# Patient Record
Sex: Female | Born: 1955
Health system: Southern US, Community
[De-identification: ages and names within clinical notes are randomized; demographics above are authoritative.]

## PROBLEM LIST (undated history)

## (undated) DIAGNOSIS — J449 Chronic obstructive pulmonary disease, unspecified: Secondary | ICD-10-CM

## (undated) DIAGNOSIS — I4892 Unspecified atrial flutter: Secondary | ICD-10-CM

## (undated) DIAGNOSIS — I10 Essential (primary) hypertension: Secondary | ICD-10-CM

## (undated) HISTORY — PX: TEMPOROMANDIBULAR JOINT SURGERY: SHX35

## (undated) HISTORY — DX: Chronic obstructive pulmonary disease, unspecified: J44.9

## (undated) HISTORY — PX: TONSILLECTOMY: SUR1361

## (undated) HISTORY — DX: Unspecified atrial flutter: I48.92

## (undated) HISTORY — PX: ABDOMINAL HYSTERECTOMY: SHX81

---

## 2001-09-04 ENCOUNTER — Other Ambulatory Visit: Admission: RE | Admit: 2001-09-04 | Discharge: 2001-09-04 | Payer: Self-pay | Admitting: Obstetrics and Gynecology

## 2001-10-02 ENCOUNTER — Other Ambulatory Visit: Admission: RE | Admit: 2001-10-02 | Discharge: 2001-10-02 | Payer: Self-pay | Admitting: Obstetrics and Gynecology

## 2002-09-04 ENCOUNTER — Ambulatory Visit (HOSPITAL_COMMUNITY): Admission: RE | Admit: 2002-09-04 | Discharge: 2002-09-04 | Payer: Self-pay | Admitting: Obstetrics and Gynecology

## 2002-09-04 ENCOUNTER — Encounter: Payer: Self-pay | Admitting: Obstetrics and Gynecology

## 2002-09-26 ENCOUNTER — Observation Stay (HOSPITAL_COMMUNITY): Admission: RE | Admit: 2002-09-26 | Discharge: 2002-09-27 | Payer: Self-pay | Admitting: Obstetrics and Gynecology

## 2002-11-19 ENCOUNTER — Encounter: Payer: Self-pay | Admitting: Internal Medicine

## 2002-11-19 ENCOUNTER — Ambulatory Visit (HOSPITAL_COMMUNITY): Admission: RE | Admit: 2002-11-19 | Discharge: 2002-11-19 | Payer: Self-pay | Admitting: Internal Medicine

## 2005-07-12 ENCOUNTER — Ambulatory Visit (HOSPITAL_COMMUNITY): Admission: RE | Admit: 2005-07-12 | Discharge: 2005-07-12 | Payer: Self-pay | Admitting: Internal Medicine

## 2006-07-20 ENCOUNTER — Ambulatory Visit (HOSPITAL_COMMUNITY): Admission: RE | Admit: 2006-07-20 | Discharge: 2006-07-20 | Payer: Self-pay | Admitting: Internal Medicine

## 2007-11-19 ENCOUNTER — Ambulatory Visit (HOSPITAL_COMMUNITY): Admission: RE | Admit: 2007-11-19 | Discharge: 2007-11-19 | Payer: Self-pay | Admitting: Internal Medicine

## 2010-06-27 ENCOUNTER — Ambulatory Visit (HOSPITAL_COMMUNITY)
Admission: RE | Admit: 2010-06-27 | Discharge: 2010-06-27 | Payer: Self-pay | Source: Home / Self Care | Admitting: Internal Medicine

## 2011-02-03 NOTE — Op Note (Signed)
Sophia Mccoy, MASKELL                          ACCOUNT NO.:  [id number]   MEDICAL RECORD NO.:  [id number]                   PATIENT TYPE:  INP   LOCATION:  A428                                 FACILITY:  [facility]   PHYSICIAN:  Sophia Mccoy, M.D.              DATE OF BIRTH:  07-13-1956   DATE OF PROCEDURE:  09/26/2002  DATE OF DISCHARGE:                                 OPERATIVE REPORT   PREOPERATIVE DIAGNOSES:  1. Rectocele.  2. Uterine descensus.   POSTOPERATIVE DIAGNOSES:  1. Rectocele.  2. Uterine descensus, second degree.   PROCEDURES:  1. Vaginal hysterectomy.  2. Left oophorectomy.  3. Right salpingo-oophorectomy.  4. Posterior vaginal repair.   SURGEON:  Sophia Mccoy, M.D.   ASSISTANTEarlene Plater, R.N., Montelongo, C.S.T., Alinda Money, R.N.   ANESTHESIA:  General,  __________ C.R.N.A.   COMPLICATIONS:  None.   ESTIMATED BLOOD LOSS:  Less than 50 cubic centimeters.   FINDINGS:  Generalized lax tissues throughout perineum.  Clinically  significant rectocele.  Uterus easily descending to introitus while patient  asleep.  No left tube found, status post tubal sterilization.   DESCRIPTION OF PROCEDURE:  The patient was taken to the operating room,  prepped and draped for vaginal procedure with legs in candy cane leg  supports with comfortable positioning.  Perineum was draped with vaginal bib  in place, and cervix grasped with the thyroid tenaculum.  The cervix was  circumscribed with the Bovie cautery and the bladder flap elevated  anteriorly.  Posterior colpotomy incision was made, identifying the  peritoneal cavity.  The cul-de-sac tissues were quite loose.  A weighted  speculum was placed in the posterior vaginal vault.  A three-inch weighted  speculum was placed in the posterior vaginal vault.   The uterosacral ligaments were clamped with a Z clamp, cut, and suture  ligated with 0 chromic.  The lower cardinal ligaments were then isolated,  clamped, cut, and  suture ligated on either side.  At this time the anterior  vesicouterine reflection of the peritoneum was identified and the peritoneal  bladder flap opened.  The upper cardinal ligaments were then taken down  using Zeppelin clamps, Mayo scissors transection, and 0 chromic suture  ligature.  At this point the broad ligament remnants were serially clamped,  cut, and suture ligated on either side using 0 chromic, Zeppelin clamps, and  the Mayo scissors.  At this time the ovary on the patient's left was easily  visible.  The left supporting tissues of the uterus were then taken down,  cross-clamping the round ligament on the left, transecting it, then  inspecting the left adnexa.  It was felt that it would be easiest if the  uterus was freed up first and then the tube and ovary identified and taken  out separately.  Therefore, a Zeppelin clamp was placed across the remaining  supportive ligaments including the utero-ovarian ligament,  cut, and suture  ligated with 0 chromic.   The infundibulopelvic ligament on the left side was easily identified.  There were no fallopian tube remnants on the left side.  It is theorized  that those were destroyed as a part of the tubal sterilization procedure.  The left infundibulopelvic ligament was crossclamped just above the ovary,  the ovary cut free, and the pedicle ligated.  Hemostasis was excellent.  Attention was then directed to the right side of the uterus, where the utero-  ovarian ligament was clamped, cut, and suture ligated, and the round  ligament on this side taken down separately.  The infundibulopelvic ligament  could be identified just above a broad, flat, normal-appearing right ovary,  which was pulled down to reveal a small remnant of fallopian tube on the  right side, and both tube remnant and ovary were taken out by cross-clamping  just above the ovary on the IP ligament, removing tube and ovary, and doubly  ligating the IP ligament  pedicle.  Hemostasis was excellent.   The posterior pelvic structures were inspected, and the uterosacral ligament  remnants were found to be quite lax.  We were able to place a permanent silk  suture into some of the uterosacral ligament remnants on each side, pulling  them together in the midline to develop some mid-pelvis support.  The  peritoneal surfaces were then pulled together after releasing the IP  ligament pedicles upward.  The pelvic peritoneum was pulled together  anteriorly to posteriorly and then the cuff closed with the gun.   Cuff closure consisted of removal of a small triangle of tissue from the  anterior portion of the cuff closure, then closing the cuff in the midline  using a series of interrupted 2-0 chromic sutures plus pulling the  uterosacral ligament pedicles together and tying them together in the  midline.  The cuff support was quite good.   Attention was directed to the posterior repair with a triangle of posterior  perineal body tissue removed, revealing underlying supportive tissue.  A  double-gloved right index finger was placed inside the rectum to help [place]  identify the limits of the rectal tissues, and lateral tissues above and  lateral to the rectum were identified on each side.  The entire perirectal  tissues were very elastic, and it was somewhat difficulty to identify good  supportive anteriorly, but enough support could be obtained by a series of  vertical mattress sutures that the tendency toward rectocele was  dramatically improved.  The perineal body was then built up by a series of  three interrupted 0 Dexon sutures, pulling the bulbocavernosus together in  the midline, reinforcing and rebuilding the perineal body and reinforcing  the posterior repair stitches placed earlier.  A small amount of redundant  vaginal mucosa was then trimmed and then the edges reapproximated using interrupted 2-0 chromic with good support and tissue edge  approximation.  The patient tolerated the procedure well, went to the recovery room in good  condition.    ADDENDUM:  The patient showed a mild tendency toward hypertension during the  surgery and one time required some antihypertensive medications during the  surgery and then, approximately two hours post surgery, she showed a blood  pressure elevation to 190-202/80-91 diastolic, which responded nicely to  labetalol.  Sophia Mccoy, M.D.    JVF/MEDQ  D:  09/26/2002  T:  09/26/2002  Job:  [id number]   cc:   Kingsley Callander. Ouida Sills, M.D.  [address]  [place]  [place] [address]  Fax: [phone]

## 2011-02-03 NOTE — Discharge Summary (Signed)
   Sophia Mccoy, Sophia Mccoy                          ACCOUNT NO.:  [id number]   MEDICAL RECORD NO.:  [id number]                   PATIENT TYPE:  INP   LOCATION:  A428                                 FACILITY:  [facility]   PHYSICIAN:  Tilda Burrow, M.D.              DATE OF BIRTH:  02-26-1956   DATE OF ADMISSION:  09/26/2002  DATE OF DISCHARGE:                                 DISCHARGE SUMMARY   ADMISSION DIAGNOSES:  1. Rectocele.  2. Pelvic relaxation.   DISCHARGE DIAGNOSES:  1. Rectocele.  2. Pelvic relaxation.   PROCEDURES:  Vaginal hysterectomy, left oophorectomy, right salpingo-  oophorectomy, posterior repair.   DISCHARGE MEDICATIONS:  1. Tylox 1 q.4h. p.r.n. pain, dispense 20.  2. Surfak 1 p.o. b.i.d. x 2 weeks.  3. Doxycycline 100 mg b.i.d. x 7 days (prophylaxis).   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  See history for details.  The patient had begun to notice the cervix at the introitus at the end of a  long day.   HOSPITAL COURSE:  The patient underwent vaginal hysterectomy, posterior  repair with removal of tubes and ovaries.  The left ovary was removed.  There was no left tube (the patient is status post tubal ligation in 1977,  apparently by cautery).   The patient had admitting laboratory including hemoglobin of 13, hematocrit  40.  Postoperatively she was stable except for a brief transient episode two  hours postoperatively where she had blood pressure elevation to 190/90 which  required a single dose of labetalol 20 mg IV.  Postoperative laboratory  reports included hemoglobin 10.8, hematocrit 31.7, somewhat lower than  anticipated given her preoperative status and the minimal blood loss  estimated at less than 100 cc.  White count was 7800.  She had active bowel  sounds, soft abdomen, no distention, and was  stable for discharge at 24  hours postoperatively.  Follow-up will be in two weeks and as needed  thereafter.   ADDENDUM:  Pathology report not back at this time.                                               Tilda Burrow, M.D.    JVF/MEDQ  D:  09/27/2002  T:  09/27/2002  Job:  [id number]   cc:   Kingsley Callander. Ouida Sills, M.D.  [address]  [place]  [place] [address]  Fax: [phone]

## 2011-02-03 NOTE — H&P (Signed)
NAME:  Sophia Mccoy, Sophia Mccoy                          ACCOUNT NO.:  [id number]   MEDICAL RECORD NO.:  [id number]                   PATIENT TYPE:  AMB   LOCATION:  DAY                                  FACILITY:  [facility]   PHYSICIAN:  Tilda Burrow, M.D.              DATE OF BIRTH:  [birth year]   DATE OF ADMISSION:  DATE OF DISCHARGE:                                HISTORY & PHYSICAL   ADMITTING DIAGNOSES:  1. Rectocele.  2. First degree uterine descensus with pelvic discomfort.  __________.   HISTORY OF PRESENT ILLNESS:  This 55 year old postmenopausal female with  documented postmenopausal FSH values has been followed over the past year  for a variety of GYN complaints.  She has been on hormone replacement  therapy since last October.  She had irregular bleeding in early January  2003 which was resolved with removal of a small endometrial polyp.  Vaginal  ultrasound has been performed showing a thin endometrial stripe, a 1.4 cm  uterine fibroid.  She has low back discomfort which we think is related to  first degree uterine descensus and it seems reproducible in November  examination when uterine tenderness was present.  This resolved with  doxycycline but the discomfort continued.   REVIEW OF SYSTEMS:  Positive for urinary frequency, but not stress  incontinence.   The patient is admitted for vaginal hysterectomy and posterior repair.  Due  to her postmenopausal status, plans are to attempt bilateral salpingo-  oophorectomy portion of the procedure.  The patient is aware the technical  limitations sometimes make ovarian removal challenging.  If ovaries cannot  be removed vaginally, she is comfortable with leaving them as she is  postmenopausal.   PAST MEDICAL HISTORY:  Positive for arthritis.   PAST SURGICAL HISTORY:  1. Tonsillectomy 1967.  2. Tubal ligation 1977.   ALLERGIES:  CODEINE causing nausea but the patient has been able to take  Vicoprofen without difficulty.   PENICILLIN caused childhood rash but she has  taken ampicillin and cephalosporins without difficulty.   PHYSICAL EXAMINATION:  GENERAL:  Somber appearing, slim Caucasian female.  Alert and oriented x3.  HEENT:  Pupils equal, round, reactive.  Extraocular movements are intact.  Pharynx clear.  NECK:  Supple.  Trachea midline.  CHEST:  Clear to auscultation.  ABDOMEN:  Nontender.  PELVIC:  External genitalia:  Mildly relaxed introitus, first degree uterine  descensus.  Cervix multiparous.  GC and Chlamydia negative.  Pap smear  recently performed class 1.  Uterus estimated weight 120 g, mobile.  RECTAL:  Mild rectocele present with digital rectal examination showed  greater than 90 degrees.   PLAN:  Vaginal hysterectomy, posterior repair with removal of ovaries  provided surgical access permits on September 25, 2002.  Tilda Burrow, M.D.    JVF/MEDQ  D:  09/23/2002  T:  09/23/2002  Job:  [id number]

## 2012-01-22 ENCOUNTER — Other Ambulatory Visit (HOSPITAL_COMMUNITY): Payer: Self-pay | Admitting: Internal Medicine

## 2012-01-22 DIAGNOSIS — Z139 Encounter for screening, unspecified: Secondary | ICD-10-CM

## 2012-01-29 ENCOUNTER — Ambulatory Visit (HOSPITAL_COMMUNITY): Payer: Self-pay

## 2012-02-01 ENCOUNTER — Ambulatory Visit (HOSPITAL_COMMUNITY)
Admission: RE | Admit: 2012-02-01 | Discharge: 2012-02-01 | Disposition: A | Discharge status: Home / Self Care | Payer: 59 | Source: Ambulatory Visit | Attending: Internal Medicine | Admitting: Internal Medicine

## 2012-02-01 DIAGNOSIS — Z1231 Encounter for screening mammogram for malignant neoplasm of breast: Secondary | ICD-10-CM | POA: Insufficient documentation

## 2012-02-01 DIAGNOSIS — Z139 Encounter for screening, unspecified: Secondary | ICD-10-CM

## 2012-11-02 ENCOUNTER — Other Ambulatory Visit: Payer: Self-pay

## 2013-07-24 ENCOUNTER — Other Ambulatory Visit: Payer: Self-pay

## 2013-08-21 ENCOUNTER — Other Ambulatory Visit (HOSPITAL_COMMUNITY): Payer: Self-pay | Admitting: Internal Medicine

## 2013-08-21 DIAGNOSIS — Z139 Encounter for screening, unspecified: Secondary | ICD-10-CM

## 2013-08-22 ENCOUNTER — Ambulatory Visit (HOSPITAL_COMMUNITY)
Admission: RE | Admit: 2013-08-22 | Discharge: 2013-08-22 | Disposition: A | Discharge status: Home / Self Care | Payer: 59 | Source: Ambulatory Visit | Attending: Internal Medicine | Admitting: Internal Medicine

## 2013-08-22 DIAGNOSIS — Z139 Encounter for screening, unspecified: Secondary | ICD-10-CM

## 2013-08-22 DIAGNOSIS — Z1231 Encounter for screening mammogram for malignant neoplasm of breast: Secondary | ICD-10-CM | POA: Insufficient documentation

## 2014-04-02 ENCOUNTER — Telehealth (HOSPITAL_COMMUNITY): Payer: Self-pay

## 2014-04-02 NOTE — Telephone Encounter (Signed)
Hold Coumadin until confirmed

## 2014-04-02 NOTE — Telephone Encounter (Signed)
Telephone call was an erroneous entry, incorrect patient.

## 2014-04-02 NOTE — Telephone Encounter (Signed)
Erroneous encounter

## 2014-04-02 NOTE — Telephone Encounter (Signed)
Fingerstick done for INR and reading was INR greater than 8.  Blood will be sent to [facility] for stat INR per home health protocol.

## 2014-07-03 ENCOUNTER — Other Ambulatory Visit: Payer: Self-pay

## 2015-02-24 ENCOUNTER — Other Ambulatory Visit (HOSPITAL_COMMUNITY): Payer: Self-pay | Admitting: Internal Medicine

## 2015-02-24 DIAGNOSIS — Z1231 Encounter for screening mammogram for malignant neoplasm of breast: Secondary | ICD-10-CM

## 2015-03-03 ENCOUNTER — Ambulatory Visit (HOSPITAL_COMMUNITY)
Admission: RE | Admit: 2015-03-03 | Discharge: 2015-03-03 | Disposition: A | Discharge status: Home / Self Care | Payer: 59 | Source: Ambulatory Visit | Attending: Internal Medicine | Admitting: Internal Medicine

## 2015-03-03 DIAGNOSIS — Z1231 Encounter for screening mammogram for malignant neoplasm of breast: Secondary | ICD-10-CM | POA: Insufficient documentation

## 2015-03-14 ENCOUNTER — Emergency Department (HOSPITAL_COMMUNITY)
Admission: EM | Admit: 2015-03-14 | Discharge: 2015-03-14 | Disposition: A | Discharge status: Home / Self Care | Payer: 59 | Attending: Emergency Medicine | Admitting: Emergency Medicine

## 2015-03-14 ENCOUNTER — Encounter (HOSPITAL_COMMUNITY): Payer: Self-pay | Admitting: *Deleted

## 2015-03-14 DIAGNOSIS — Y9389 Activity, other specified: Secondary | ICD-10-CM | POA: Insufficient documentation

## 2015-03-14 DIAGNOSIS — Z79899 Other long term (current) drug therapy: Secondary | ICD-10-CM | POA: Diagnosis not present

## 2015-03-14 DIAGNOSIS — X58XXXA Exposure to other specified factors, initial encounter: Secondary | ICD-10-CM | POA: Insufficient documentation

## 2015-03-14 DIAGNOSIS — T464X5A Adverse effect of angiotensin-converting-enzyme inhibitors, initial encounter: Secondary | ICD-10-CM | POA: Insufficient documentation

## 2015-03-14 DIAGNOSIS — Y998 Other external cause status: Secondary | ICD-10-CM | POA: Insufficient documentation

## 2015-03-14 DIAGNOSIS — I10 Essential (primary) hypertension: Secondary | ICD-10-CM | POA: Diagnosis not present

## 2015-03-14 DIAGNOSIS — T783XXA Angioneurotic edema, initial encounter: Secondary | ICD-10-CM | POA: Insufficient documentation

## 2015-03-14 DIAGNOSIS — Y9289 Other specified places as the place of occurrence of the external cause: Secondary | ICD-10-CM | POA: Diagnosis not present

## 2015-03-14 DIAGNOSIS — Z7982 Long term (current) use of aspirin: Secondary | ICD-10-CM | POA: Insufficient documentation

## 2015-03-14 DIAGNOSIS — R220 Localized swelling, mass and lump, head: Secondary | ICD-10-CM | POA: Diagnosis present

## 2015-03-14 HISTORY — DX: Essential (primary) hypertension: I10

## 2015-03-14 MED ORDER — FAMOTIDINE IN NACL 20-0.9 MG/50ML-% IV SOLN
20.0000 mg | Freq: Once | INTRAVENOUS | Status: AC
  Administered 2015-03-14: 20 mg via INTRAVENOUS
  Filled 2015-03-14: qty 50

## 2015-03-14 MED ORDER — DIPHENHYDRAMINE HCL 50 MG/ML IJ SOLN
25.0000 mg | Freq: Once | INTRAMUSCULAR | Status: AC
  Administered 2015-03-14: 25 mg via INTRAVENOUS
  Filled 2015-03-14: qty 1

## 2015-03-14 MED ORDER — METHYLPREDNISOLONE SODIUM SUCC 125 MG IJ SOLR
125.0000 mg | Freq: Once | INTRAMUSCULAR | Status: AC
  Administered 2015-03-14: 125 mg via INTRAVENOUS
  Filled 2015-03-14: qty 2

## 2015-03-14 MED ORDER — PREDNISONE 50 MG PO TABS: 50.0000 mg | ORAL_TABLET | Freq: Every day | ORAL | Status: DC

## 2015-03-14 NOTE — ED Notes (Signed)
Pt's edema to tongue considerably less. No obstruction to airway. No signs of distress.

## 2015-03-14 NOTE — ED Notes (Signed)
Pt states her tongue started swelling tonight.

## 2015-03-14 NOTE — ED Provider Notes (Signed)
CSN: [id number]     Arrival date & time 03/14/15  0218 History   First MD Initiated Contact with Patient 03/14/15 0244     Chief Complaint  Patient presents with  . Oral Swelling     (Consider location/radiation/quality/duration/timing/severity/associated sxs/prior Treatment) The history is provided by the patient.   59 year old female noted swelling in her tongue tonight when she went to bed. She denies any difficulty breathing or swallowing. This is never happened before. She is taking lisinopril and states she has been on it for 12 years. She is also starting to notice some mild swelling of her lower lip.   Past Medical History  Diagnosis Date  . Hypertension    Past Surgical History  Procedure Laterality Date  . Abdominal hysterectomy    . Tonsillectomy    . Temporomandibular joint surgery     No family history on file. History  Substance Use Topics  . Smoking status: Never Smoker   . Smokeless tobacco: Not on file  . Alcohol Use: No   OB History    No data available     Review of Systems  All other systems reviewed and are negative.     Allergies  Lisinopril  Home Medications   Prior to Admission medications   Medication Sig Start Date End Date Taking? Authorizing Provider  Ascorbic Acid (VITAMIN C) 1000 MG tablet Take 1,000 mg by mouth daily.   Yes Historical Provider, MD  aspirin 81 MG tablet Take 81 mg by mouth daily.   Yes Historical Provider, MD  atorvastatin (LIPITOR) 10 MG tablet Take 10 mg by mouth daily.   Yes Historical Provider, MD  cholecalciferol (VITAMIN D) 1000 UNITS tablet Take 1,000 Units by mouth daily.   Yes Historical Provider, MD  lisinopril (PRINIVIL,ZESTRIL) 20 MG tablet Take 20 mg by mouth daily.   Yes Historical Provider, MD  vitamin E 1000 UNIT capsule Take 1,000 Units by mouth daily.   Yes Historical Provider, MD   BP 132/69 mmHg  Pulse 56  Temp(Src) 97.5 F (36.4 C) (Oral)  Resp 20  Ht 5\' 4"  (1.626 m)  Wt 103 lb (46.72 kg)   BMI 17.67 kg/m2  SpO2 95% Physical Exam  Nursing note and vitals reviewed.  59 year old female, resting comfortably and in no acute distress. Vital signs are normal. Oxygen saturation is 95%, which is normal. Head is normocephalic and atraumatic. PERRLA, EOMI. there is mild angioedema of the tongue and sublingual tissue as well as the uvula. She has no difficulty with secretions. There is no stridor. Neck is nontender and supple without adenopathy or JVD. Back is nontender and there is no CVA tenderness. Lungs are clear without rales, wheezes, or rhonchi. Chest is nontender. Heart has regular rate and rhythm without murmur. Abdomen is soft, flat, nontender without masses or hepatosplenomegaly and peristalsis is normoactive. Extremities have no cyanosis or edema, full range of motion is present. Skin is warm and dry without rash. Neurologic: Mental status is normal, cranial nerves are intact, there are no motor or sensory deficits.  ED Course  Procedures (including critical care time)  CRITICAL CARE Performed by: YDXAJ,OINOM Total critical care time: 35 minutes Critical care time was exclusive of separately billable procedures and treating other patients. Critical care was necessary to treat or prevent imminent or life-threatening deterioration. Critical care was time spent personally by me on the following activities: development of treatment plan with patient and/or surrogate as well as nursing, discussions with consultants, evaluation of  patient's response to treatment, examination of patient, obtaining history from patient or surrogate, ordering and performing treatments and interventions, ordering and review of laboratory studies, ordering and review of radiographic studies, pulse oximetry and re-evaluation of patient's condition.  MDM   Final diagnoses:  ACE inhibitor-aggravated angioedema, initial encounter    Angioedema of the tongue secondary to ACE inhibitor. She is given  methylprednisolone, diphenhydramine, and famotidine and will be observed in the ED. Old records are reviewed and she has no relevant past visits.  4:23 AM She is doing much better. Tongue swelling has decreased. Speech is much clearer.  5:58 AM She continues to do well with no signs of worsening of her swelling. She is discharged with prescription for prednisone and is advised using over-the-counter nonsedating any histamines for the next 5 days. I will per PCP. She is to discontinue taking lisinopril and will need to discuss with her PCP whether she needs to be on something also to replace it.  Delora Fuel, MD 83/15/17 [id number]

## 2015-03-14 NOTE — ED Notes (Signed)
Pt's tongue edema improved. Speech clear. Airway clear. No signs of distress.

## 2015-03-14 NOTE — ED Notes (Signed)
Pt left ED, ambulatory, with no sign of distress. Pt verbalized discharge instructions.

## 2015-03-14 NOTE — Discharge Instructions (Signed)
Stop taking lisinopril - it is causing the swelling in your tongue. Never take it (or any other ACE Inhibitor) again!  Take Claritin or Zyrtec once a day for the next five days. Return if the swelling starts to get worse. Talk with your doctor about whether you will need to get a different prescription to replace the lisinopril.   Angioedema Angioedema is a sudden swelling of tissues, often of the skin. It can occur on the face or genitals or in the abdomen or other body parts. The swelling usually develops over a short period and gets better in 24 to 48 hours. It often begins during the night and is found when the person wakes up. The person may also get red, itchy patches of skin (hives). Angioedema can be dangerous if it involves swelling of the air passages.  Depending on the cause, episodes of angioedema may only happen once, come back in unpredictable patterns, or repeat for several years and then gradually fade away.  CAUSES  Angioedema can be caused by an allergic reaction to various triggers. It can also result from nonallergic causes, including reactions to drugs, immune system disorders, viral infections, or an abnormal gene that is passed to you from your parents (hereditary). For some people with angioedema, the cause is unknown.  Some things that can trigger angioedema include:   Foods.   Medicines, such as ACE inhibitors, ARBs, nonsteroidal anti-inflammatory agents, or estrogen.   Latex.   Animal saliva.   Insect stings.   Dyes used in X-rays.   Mild injury.   Dental work.  Surgery.  Stress.   Sudden changes in temperature.   Exercise. SIGNS AND SYMPTOMS   Swelling of the skin.  Hives. If these are present, there is also intense itching.  Redness in the affected area.   Pain in the affected area.  Swollen lips or tongue.  Breathing problems. This may happen if the air passages swell.  Wheezing. If internal organs are involved, there may be:     Nausea.   Abdominal pain.   Vomiting.   Difficulty swallowing.   Difficulty passing urine. DIAGNOSIS   Your health care provider will examine the affected area and take a medical and family history.  Various tests may be done to help determine the cause. Tests may include:  Allergy skin tests to see if the problem is an allergic reaction.   Blood tests to check for hereditary angioedema.   Tests to check for underlying diseases that could cause the condition.   A review of your medicines, including over-the-counter medicines, may be done. TREATMENT  Treatment will depend on the cause of the angioedema. Possible treatments include:   Removal of anything that triggered the condition (such as stopping certain medicines).   Medicines to treat symptoms or prevent attacks. Medicines given may include:   Antihistamines.   Epinephrine injection.   Steroids.   Hospitalization may be required for severe attacks. If the air passages are affected, it can be an emergency. Tubes may need to be placed to keep the airway open. HOME CARE INSTRUCTIONS   Take all medicines as directed by your health care provider.  If you were given medicines for emergency allergy treatment, always carry them with you.  Wear a medical bracelet as directed by your health care provider.   Avoid known triggers. SEEK MEDICAL CARE IF:   You have repeat attacks of angioedema.   Your attacks are more frequent or more severe despite preventive measures.  You have hereditary angioedema and are considering having children. It is important to discuss with your health care provider the risks of passing the condition on to your children. SEEK IMMEDIATE MEDICAL CARE IF:   You have severe swelling of the mouth, tongue, or lips.  You have difficulty breathing.   You have difficulty swallowing.   You faint. MAKE SURE YOU:  Understand these instructions.  Will watch your  condition.  Will get help right away if you are not doing well or get worse. Document Released: 11/13/2001 Document Revised: 01/19/2014 Document Reviewed: 04/28/2013 [facility] Patient Information 2015 [place], [place]. This information is not intended to replace advice given to you by your health care provider. Make sure you discuss any questions you have with your health care provider.

## 2015-11-30 MED FILL — ATORVASTATIN 10 MG TABLET: 10 | 90 days supply | Qty: 90 | Fill #2

## 2015-12-06 MED FILL — CARTIA XT 180 MG CAPSULE SA: 180 | 90 days supply | Qty: 180 | Fill #3

## 2015-12-13 MED FILL — CHLORTHALIDONE 25 MG TABLET: 25 | 90 days supply | Qty: 90 | Fill #1

## 2015-12-24 DIAGNOSIS — E785 Hyperlipidemia, unspecified: Secondary | ICD-10-CM | POA: Diagnosis not present

## 2015-12-24 DIAGNOSIS — Z681 Body mass index (BMI) 19 or less, adult: Secondary | ICD-10-CM | POA: Diagnosis not present

## 2015-12-24 DIAGNOSIS — I10 Essential (primary) hypertension: Secondary | ICD-10-CM | POA: Diagnosis not present

## 2016-03-02 MED FILL — ATORVASTATIN 10 MG TABLET: 10 | 90 days supply | Qty: 90 | Fill #3

## 2016-03-08 MED FILL — CHLORTHALIDONE 25 MG TABLET: 25 | 90 days supply | Qty: 90 | Fill #2

## 2016-03-08 MED FILL — DILTIAZEM ER 360 MG TABLET: 360 | 90 days supply | Qty: 90 | Fill #0

## 2016-06-02 MED FILL — CHLORTHALIDONE 25 MG TABLET: 25 | 90 days supply | Qty: 90 | Fill #3

## 2016-06-02 MED FILL — ATORVASTATIN 10 MG TABLET: 10 | 90 days supply | Qty: 90 | Fill #0

## 2016-06-02 MED FILL — DILTIAZEM ER 360 MG TABLET: 360 | 90 days supply | Qty: 90 | Fill #1

## 2016-06-17 DIAGNOSIS — R7301 Impaired fasting glucose: Secondary | ICD-10-CM | POA: Diagnosis not present

## 2016-06-17 DIAGNOSIS — E785 Hyperlipidemia, unspecified: Secondary | ICD-10-CM | POA: Diagnosis not present

## 2016-06-17 DIAGNOSIS — Z79899 Other long term (current) drug therapy: Secondary | ICD-10-CM | POA: Diagnosis not present

## 2016-06-17 DIAGNOSIS — I10 Essential (primary) hypertension: Secondary | ICD-10-CM | POA: Diagnosis not present

## 2016-06-28 DIAGNOSIS — E785 Hyperlipidemia, unspecified: Secondary | ICD-10-CM | POA: Diagnosis not present

## 2016-06-28 DIAGNOSIS — I10 Essential (primary) hypertension: Secondary | ICD-10-CM | POA: Diagnosis not present

## 2016-06-28 DIAGNOSIS — C44311 Basal cell carcinoma of skin of nose: Secondary | ICD-10-CM | POA: Diagnosis not present

## 2016-07-10 ENCOUNTER — Other Ambulatory Visit (HOSPITAL_COMMUNITY): Payer: Self-pay | Admitting: Internal Medicine

## 2016-07-10 DIAGNOSIS — Z1231 Encounter for screening mammogram for malignant neoplasm of breast: Secondary | ICD-10-CM

## 2016-07-19 ENCOUNTER — Ambulatory Visit (HOSPITAL_COMMUNITY): Payer: 59

## 2016-07-27 ENCOUNTER — Ambulatory Visit (HOSPITAL_COMMUNITY)
Admission: RE | Admit: 2016-07-27 | Discharge: 2016-07-27 | Disposition: A | Discharge status: Home / Self Care | Payer: 59 | Source: Ambulatory Visit | Attending: Internal Medicine | Admitting: Internal Medicine

## 2016-07-27 ENCOUNTER — Ambulatory Visit (HOSPITAL_COMMUNITY): Payer: 59

## 2016-07-27 ENCOUNTER — Other Ambulatory Visit (HOSPITAL_COMMUNITY): Payer: Self-pay | Admitting: Internal Medicine

## 2016-07-27 DIAGNOSIS — Z1231 Encounter for screening mammogram for malignant neoplasm of breast: Secondary | ICD-10-CM | POA: Diagnosis not present

## 2016-08-01 DIAGNOSIS — D485 Neoplasm of uncertain behavior of skin: Secondary | ICD-10-CM | POA: Diagnosis not present

## 2016-08-01 DIAGNOSIS — D225 Melanocytic nevi of trunk: Secondary | ICD-10-CM | POA: Diagnosis not present

## 2016-08-01 DIAGNOSIS — Z1283 Encounter for screening for malignant neoplasm of skin: Secondary | ICD-10-CM | POA: Diagnosis not present

## 2016-08-01 DIAGNOSIS — Z08 Encounter for follow-up examination after completed treatment for malignant neoplasm: Secondary | ICD-10-CM | POA: Diagnosis not present

## 2016-08-01 DIAGNOSIS — Z85828 Personal history of other malignant neoplasm of skin: Secondary | ICD-10-CM | POA: Diagnosis not present

## 2016-08-29 MED FILL — DILTIAZEM ER 360 MG TABLET: 360 | 90 days supply | Qty: 90 | Fill #2

## 2016-08-29 MED FILL — ATORVASTATIN 10 MG TABLET: 10 | 90 days supply | Qty: 90 | Fill #1

## 2016-08-29 MED FILL — CHLORTHALIDONE 25 MG TABLET: 25 | 90 days supply | Qty: 90 | Fill #4

## 2016-10-12 DIAGNOSIS — C44311 Basal cell carcinoma of skin of nose: Secondary | ICD-10-CM | POA: Diagnosis not present

## 2016-11-24 MED FILL — ATORVASTATIN 10 MG TABLET: 10 | 90 days supply | Qty: 90 | Fill #2

## 2016-11-24 MED FILL — DILTIAZEM ER 360 MG TABLET: 360 | 90 days supply | Qty: 90 | Fill #3

## 2016-11-27 MED FILL — CHLORTHALIDONE 25 MG TABLET: 25 | 90 days supply | Qty: 90 | Fill #0

## 2017-01-03 DIAGNOSIS — I10 Essential (primary) hypertension: Secondary | ICD-10-CM | POA: Diagnosis not present

## 2017-01-03 DIAGNOSIS — Z681 Body mass index (BMI) 19 or less, adult: Secondary | ICD-10-CM | POA: Diagnosis not present

## 2017-02-27 MED FILL — ATORVASTATIN 10 MG TABLET: 10 | 90 days supply | Qty: 90 | Fill #3

## 2017-02-27 MED FILL — CHLORTHALIDONE 25 MG TABLET: 25 | 90 days supply | Qty: 90 | Fill #1

## 2017-02-28 MED FILL — DILTIAZEM ER 360 MG TABLET: 360 | 90 days supply | Qty: 90 | Fill #0

## 2017-05-28 MED FILL — CHLORTHALIDONE 25 MG TABLET: 25 | 90 days supply | Qty: 90 | Fill #2

## 2017-05-28 MED FILL — ATORVASTATIN 10 MG TABLET: 10 | 90 days supply | Qty: 90 | Fill #4

## 2017-05-28 MED FILL — DILTIAZEM ER 360 MG TABLET: 360 | 90 days supply | Qty: 90 | Fill #1

## 2017-06-30 DIAGNOSIS — H5213 Myopia, bilateral: Secondary | ICD-10-CM | POA: Diagnosis not present

## 2017-07-06 DIAGNOSIS — E785 Hyperlipidemia, unspecified: Secondary | ICD-10-CM | POA: Diagnosis not present

## 2017-07-06 DIAGNOSIS — R7301 Impaired fasting glucose: Secondary | ICD-10-CM | POA: Diagnosis not present

## 2017-07-06 DIAGNOSIS — I10 Essential (primary) hypertension: Secondary | ICD-10-CM | POA: Diagnosis not present

## 2017-07-06 DIAGNOSIS — Z79899 Other long term (current) drug therapy: Secondary | ICD-10-CM | POA: Diagnosis not present

## 2017-07-18 DIAGNOSIS — I10 Essential (primary) hypertension: Secondary | ICD-10-CM | POA: Diagnosis not present

## 2017-07-18 DIAGNOSIS — E785 Hyperlipidemia, unspecified: Secondary | ICD-10-CM | POA: Diagnosis not present

## 2017-08-23 MED FILL — CHLORTHALIDONE 25 MG TABS: 25 | 90 days supply | Qty: 90 | Fill #3

## 2017-08-23 MED FILL — ATORVASTATIN 10 MG TABLET: 10 | 90 days supply | Qty: 90 | Fill #0

## 2017-08-23 MED FILL — DILTIAZEM ER 360 MG TABLET: 360 | 90 days supply | Qty: 90 | Fill #2

## 2017-11-23 MED FILL — CHLORTHALIDONE 25 MG TAB: 25 | 90 days supply | Qty: 90 | Fill #4

## 2017-11-23 MED FILL — DILTIAZEM ER 360 MG TABLET: 360 | 90 days supply | Qty: 90 | Fill #3

## 2017-11-23 MED FILL — ATORVASTATIN 10 MG TABLET: 10 | 90 days supply | Qty: 90 | Fill #1

## 2017-12-27 ENCOUNTER — Other Ambulatory Visit (HOSPITAL_COMMUNITY): Payer: Self-pay | Admitting: Internal Medicine

## 2017-12-27 DIAGNOSIS — Z1231 Encounter for screening mammogram for malignant neoplasm of breast: Secondary | ICD-10-CM

## 2017-12-28 ENCOUNTER — Encounter (HOSPITAL_COMMUNITY): Payer: Self-pay

## 2017-12-28 ENCOUNTER — Ambulatory Visit (HOSPITAL_COMMUNITY)
Admission: RE | Admit: 2017-12-28 | Discharge: 2017-12-28 | Disposition: A | Discharge status: Home / Self Care | Payer: 59 | Source: Ambulatory Visit | Attending: Internal Medicine | Admitting: Internal Medicine

## 2017-12-28 DIAGNOSIS — Z1231 Encounter for screening mammogram for malignant neoplasm of breast: Secondary | ICD-10-CM | POA: Diagnosis not present

## 2018-01-09 DIAGNOSIS — R300 Dysuria: Secondary | ICD-10-CM | POA: Diagnosis not present

## 2018-01-09 DIAGNOSIS — Z681 Body mass index (BMI) 19 or less, adult: Secondary | ICD-10-CM | POA: Diagnosis not present

## 2018-01-24 DIAGNOSIS — Z1211 Encounter for screening for malignant neoplasm of colon: Secondary | ICD-10-CM | POA: Diagnosis not present

## 2018-01-24 DIAGNOSIS — Z1212 Encounter for screening for malignant neoplasm of rectum: Secondary | ICD-10-CM | POA: Diagnosis not present

## 2018-02-21 MED FILL — DILTIAZEM ER 360 MG TABLET: 360 | 90 days supply | Qty: 90 | Fill #4

## 2018-02-21 MED FILL — ATORVASTATIN 10 MG TABLET: 10 | 90 days supply | Qty: 90 | Fill #2

## 2018-02-21 MED FILL — CHLORTHALIDONE 25 MG TAB: 25 | 90 days supply | Qty: 90 | Fill #0

## 2018-03-07 DIAGNOSIS — R7301 Impaired fasting glucose: Secondary | ICD-10-CM | POA: Diagnosis not present

## 2018-03-07 DIAGNOSIS — Z79899 Other long term (current) drug therapy: Secondary | ICD-10-CM | POA: Diagnosis not present

## 2018-03-07 DIAGNOSIS — I10 Essential (primary) hypertension: Secondary | ICD-10-CM | POA: Diagnosis not present

## 2018-03-07 DIAGNOSIS — E785 Hyperlipidemia, unspecified: Secondary | ICD-10-CM | POA: Diagnosis not present

## 2018-03-12 DIAGNOSIS — E785 Hyperlipidemia, unspecified: Secondary | ICD-10-CM | POA: Diagnosis not present

## 2018-03-12 DIAGNOSIS — I10 Essential (primary) hypertension: Secondary | ICD-10-CM | POA: Diagnosis not present

## 2018-03-12 DIAGNOSIS — Z681 Body mass index (BMI) 19 or less, adult: Secondary | ICD-10-CM | POA: Diagnosis not present

## 2018-05-27 MED FILL — DILTIAZEM ER 360 MG TABLET: 360 | 90 days supply | Qty: 90 | Fill #0

## 2018-05-27 MED FILL — CHLORTHALIDONE 25 MG TAB: 25 | 90 days supply | Qty: 90 | Fill #1

## 2018-05-27 MED FILL — ATORVASTATIN 10 MG TABLET: 10 | 90 days supply | Qty: 90 | Fill #3

## 2018-08-26 MED FILL — ATORVASTATIN 10 MG TABLET: 10 | 90 days supply | Qty: 90 | Fill #0

## 2018-08-26 MED FILL — CHLORTHALIDONE 25 MG TABS: 25 | 90 days supply | Qty: 90 | Fill #2

## 2018-08-26 MED FILL — DILTIAZEM ER 360 MG TABLET: 360 | 90 days supply | Qty: 90 | Fill #1

## 2018-08-31 DIAGNOSIS — H5203 Hypermetropia, bilateral: Secondary | ICD-10-CM | POA: Diagnosis not present

## 2018-09-25 DIAGNOSIS — R2233 Localized swelling, mass and lump, upper limb, bilateral: Secondary | ICD-10-CM | POA: Diagnosis not present

## 2018-09-25 DIAGNOSIS — I10 Essential (primary) hypertension: Secondary | ICD-10-CM | POA: Diagnosis not present

## 2018-11-22 MED FILL — CHLORTHALIDONE 25 MG TABS: 25 | 90 days supply | Qty: 90 | Fill #3 | Status: TO

## 2018-11-22 MED FILL — DILTIAZEM ER 360 MG TABLET: 360 | 90 days supply | Qty: 90 | Fill #2 | Status: TO

## 2018-11-23 MED FILL — ATORVASTATIN 10 MG TABLET: 10 | 90 days supply | Qty: 90 | Fill #1 | Status: TO

## 2019-02-18 ENCOUNTER — Other Ambulatory Visit (HOSPITAL_COMMUNITY): Payer: Self-pay | Admitting: Internal Medicine

## 2019-02-18 DIAGNOSIS — Z1231 Encounter for screening mammogram for malignant neoplasm of breast: Secondary | ICD-10-CM

## 2019-02-21 MED FILL — DILTIAZEM ER 360 MG TABLET: 360 | 90 days supply | Qty: 90 | Fill #0

## 2019-02-21 MED FILL — ATORVASTATIN 10 MG TABLET: 10 | 90 days supply | Qty: 90 | Fill #0

## 2019-02-21 MED FILL — CHLORTHALIDONE 25 MG TABLET: 25 | 90 days supply | Qty: 90 | Fill #0

## 2019-03-28 DIAGNOSIS — I10 Essential (primary) hypertension: Secondary | ICD-10-CM | POA: Diagnosis not present

## 2019-03-28 DIAGNOSIS — Z79899 Other long term (current) drug therapy: Secondary | ICD-10-CM | POA: Diagnosis not present

## 2019-04-16 DIAGNOSIS — I10 Essential (primary) hypertension: Secondary | ICD-10-CM | POA: Diagnosis not present

## 2019-04-16 DIAGNOSIS — E785 Hyperlipidemia, unspecified: Secondary | ICD-10-CM | POA: Diagnosis not present

## 2019-05-22 MED FILL — ATORVASTATIN 10 MG TABLET: 10 | 90 days supply | Qty: 90 | Fill #0

## 2019-05-22 MED FILL — CHLORTHALIDONE 25 MG TABS: 25 | 90 days supply | Qty: 90 | Fill #0

## 2019-05-22 MED FILL — DILTIAZEM ER 360 MG TABLET: 360 | 90 days supply | Qty: 90 | Fill #0

## 2019-08-21 MED FILL — ATORVASTATIN 10 MG TABLET: 10 | 90 days supply | Qty: 90 | Fill #1

## 2019-08-21 MED FILL — CHLORTHALIDONE 25 MG TABS: 25 | 90 days supply | Qty: 90 | Fill #1

## 2019-08-21 MED FILL — DILTIAZEM ER 360 MG TABLET: 360 | 90 days supply | Qty: 90 | Fill #0

## 2019-10-15 DIAGNOSIS — I10 Essential (primary) hypertension: Secondary | ICD-10-CM | POA: Diagnosis not present

## 2019-11-19 ENCOUNTER — Other Ambulatory Visit (HOSPITAL_COMMUNITY): Payer: Self-pay | Admitting: Internal Medicine

## 2019-11-19 MED FILL — DILTIAZEM ER 360 MG TABLET: 360 | 90 days supply | Qty: 90 | Fill #1

## 2019-11-19 MED FILL — CHLORTHALIDONE 25 MG TABS: 25 | 90 days supply | Qty: 90 | Fill #2

## 2019-11-19 MED FILL — ATORVASTATIN 10 MG TABLET: 10 | 90 days supply | Qty: 90 | Fill #0

## 2020-02-13 ENCOUNTER — Other Ambulatory Visit (HOSPITAL_COMMUNITY): Payer: Self-pay | Admitting: Internal Medicine

## 2020-02-13 DIAGNOSIS — Z1231 Encounter for screening mammogram for malignant neoplasm of breast: Secondary | ICD-10-CM

## 2020-02-17 MED FILL — CHLORTHALIDONE 25 MG TABS: 25 | 90 days supply | Qty: 90 | Fill #3

## 2020-02-17 MED FILL — DILTIAZEM ER 360 MG TABLET: 360 | 90 days supply | Qty: 90 | Fill #2

## 2020-02-17 MED FILL — ATORVASTATIN CALCIUM 10 MG: 10 | 90 days supply | Qty: 90 | Fill #1

## 2020-02-18 ENCOUNTER — Other Ambulatory Visit: Payer: Self-pay

## 2020-02-18 ENCOUNTER — Ambulatory Visit (HOSPITAL_COMMUNITY)
Admission: RE | Admit: 2020-02-18 | Discharge: 2020-02-18 | Disposition: A | Discharge status: Home / Self Care | Payer: 59 | Source: Ambulatory Visit | Attending: Internal Medicine | Admitting: Internal Medicine

## 2020-02-18 DIAGNOSIS — Z1231 Encounter for screening mammogram for malignant neoplasm of breast: Secondary | ICD-10-CM | POA: Insufficient documentation

## 2020-02-27 DIAGNOSIS — E785 Hyperlipidemia, unspecified: Secondary | ICD-10-CM | POA: Diagnosis not present

## 2020-02-27 DIAGNOSIS — I10 Essential (primary) hypertension: Secondary | ICD-10-CM | POA: Diagnosis not present

## 2020-02-27 DIAGNOSIS — Z79899 Other long term (current) drug therapy: Secondary | ICD-10-CM | POA: Diagnosis not present

## 2020-03-04 ENCOUNTER — Other Ambulatory Visit (HOSPITAL_COMMUNITY): Payer: Self-pay | Admitting: Internal Medicine

## 2020-03-04 DIAGNOSIS — Z681 Body mass index (BMI) 19 or less, adult: Secondary | ICD-10-CM | POA: Diagnosis not present

## 2020-03-04 DIAGNOSIS — R001 Bradycardia, unspecified: Secondary | ICD-10-CM | POA: Diagnosis not present

## 2020-03-04 DIAGNOSIS — Z0001 Encounter for general adult medical examination with abnormal findings: Secondary | ICD-10-CM | POA: Diagnosis not present

## 2020-03-04 DIAGNOSIS — E785 Hyperlipidemia, unspecified: Secondary | ICD-10-CM | POA: Diagnosis not present

## 2020-03-04 DIAGNOSIS — I10 Essential (primary) hypertension: Secondary | ICD-10-CM | POA: Diagnosis not present

## 2020-03-04 MED FILL — LEVOCETIRIZINE 5 MG TABLET: 5 | 90 days supply | Qty: 90 | Fill #0

## 2020-03-04 MED FILL — DILTIAZEM ER 240 MG TABLET: 240 | 90 days supply | Qty: 90 | Fill #0

## 2020-05-03 DIAGNOSIS — L308 Other specified dermatitis: Secondary | ICD-10-CM | POA: Diagnosis not present

## 2020-05-04 DIAGNOSIS — R001 Bradycardia, unspecified: Secondary | ICD-10-CM | POA: Diagnosis not present

## 2020-05-04 MED FILL — DOXYCYCLINE HYCLATE 100 MG: 100 | 10 days supply | Qty: 20 | Fill #0

## 2020-05-04 MED FILL — BETAMETHASONE DP 0.05% CRM: 0.05 | 25 days supply | Qty: 45 | Fill #0

## 2020-05-17 DIAGNOSIS — L52 Erythema nodosum: Secondary | ICD-10-CM | POA: Diagnosis not present

## 2020-05-17 DIAGNOSIS — L308 Other specified dermatitis: Secondary | ICD-10-CM | POA: Diagnosis not present

## 2020-05-20 MED FILL — CHLORTHALIDONE 25 MG TABS: 25 | 90 days supply | Qty: 90 | Fill #4

## 2020-05-20 MED FILL — ATORVASTATIN CALCIUM 10 MG: 10 | 90 days supply | Qty: 90 | Fill #2

## 2020-05-25 MED FILL — predniSONE 5 MG TABS: 5 | 7 days supply | Qty: 28 | Fill #0

## 2020-06-03 MED FILL — DILTIAZEM ER 240 MG TABLET: 240 | 90 days supply | Qty: 90 | Fill #1

## 2020-08-18 DIAGNOSIS — I10 Essential (primary) hypertension: Secondary | ICD-10-CM | POA: Diagnosis not present

## 2020-08-19 ENCOUNTER — Other Ambulatory Visit (HOSPITAL_COMMUNITY): Payer: Self-pay | Admitting: Internal Medicine

## 2020-08-19 MED FILL — ATORVASTATIN CALCIUM 10 MG: 10 | 90 days supply | Qty: 90 | Fill #3

## 2020-08-19 MED FILL — CHLORTHALIDONE 25 MG TABS: 25 | 90 days supply | Qty: 90 | Fill #0

## 2020-08-31 MED FILL — DILTIAZEM ER 240 MG TABLET: 240 | 90 days supply | Qty: 90 | Fill #2

## 2020-11-11 DIAGNOSIS — H5203 Hypermetropia, bilateral: Secondary | ICD-10-CM | POA: Diagnosis not present

## 2020-11-17 MED FILL — CHLORTHALIDONE 25 MG TABS: 25 | 90 days supply | Qty: 90 | Fill #1

## 2020-11-17 MED FILL — ATORVASTATIN CALCIUM 10 MG: 10 | 90 days supply | Qty: 90 | Fill #4

## 2020-12-18 ENCOUNTER — Other Ambulatory Visit (HOSPITAL_COMMUNITY): Payer: Self-pay

## 2020-12-23 ENCOUNTER — Other Ambulatory Visit (HOSPITAL_COMMUNITY): Payer: Self-pay

## 2020-12-28 ENCOUNTER — Other Ambulatory Visit (HOSPITAL_COMMUNITY): Payer: Self-pay

## 2021-02-16 ENCOUNTER — Other Ambulatory Visit (HOSPITAL_COMMUNITY): Payer: Self-pay

## 2021-02-16 MED FILL — Chlorthalidone Tab 25 MG: ORAL | 90 days supply | Qty: 90 | Fill #0 | Status: AC

## 2021-02-16 MED FILL — Diltiazem HCl Coated Beads Tab ER 24HR 240 MG: ORAL | 90 days supply | Qty: 90 | Fill #0 | Status: AC

## 2021-02-17 ENCOUNTER — Other Ambulatory Visit (HOSPITAL_COMMUNITY): Payer: Self-pay

## 2021-02-17 MED ORDER — ATORVASTATIN CALCIUM 10 MG PO TABS
10.0000 mg | ORAL_TABLET | Freq: Every day | ORAL | 4 refills | Status: DC
  Filled 2021-02-17: qty 90, 90d supply, fill #0
  Filled 2021-05-17: qty 90, 90d supply, fill #1
  Filled 2021-08-16: qty 90, 90d supply, fill #2
  Filled 2021-11-15: qty 90, 90d supply, fill #3
  Filled 2022-02-09: qty 90, 90d supply, fill #4

## 2021-03-09 IMAGING — MG DIGITAL SCREENING BILAT W/ TOMO W/ CAD
8 series · 9 of 24 positions shown · non-contrast
Comparison: Previous exam(s).

CLINICAL DATA: Screening.

EXAM:
DIGITAL SCREENING BILATERAL MAMMOGRAM WITH TOMO AND CAD

[R CC synth-2D]
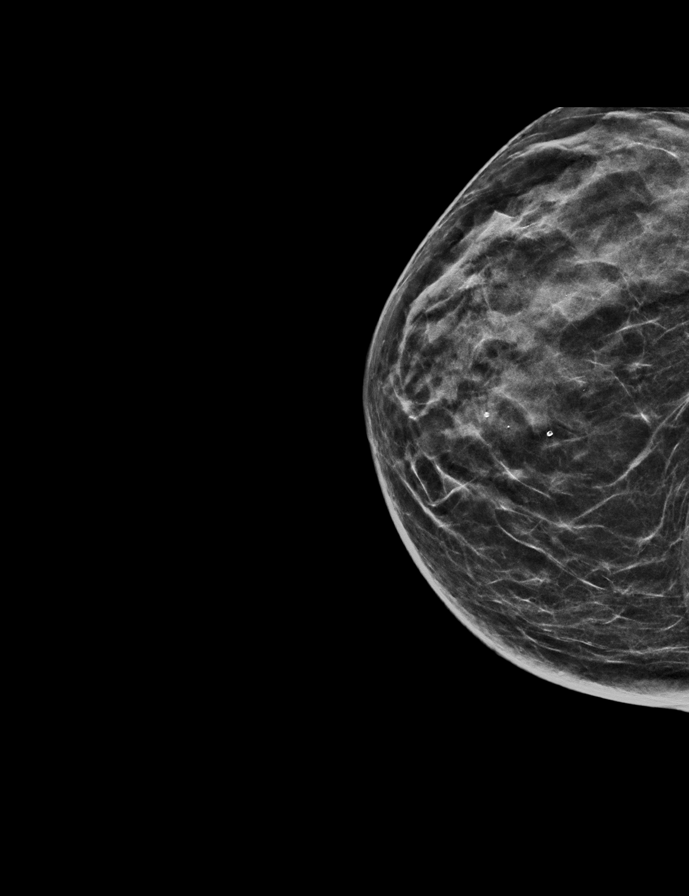

[L MLO synth-2D]
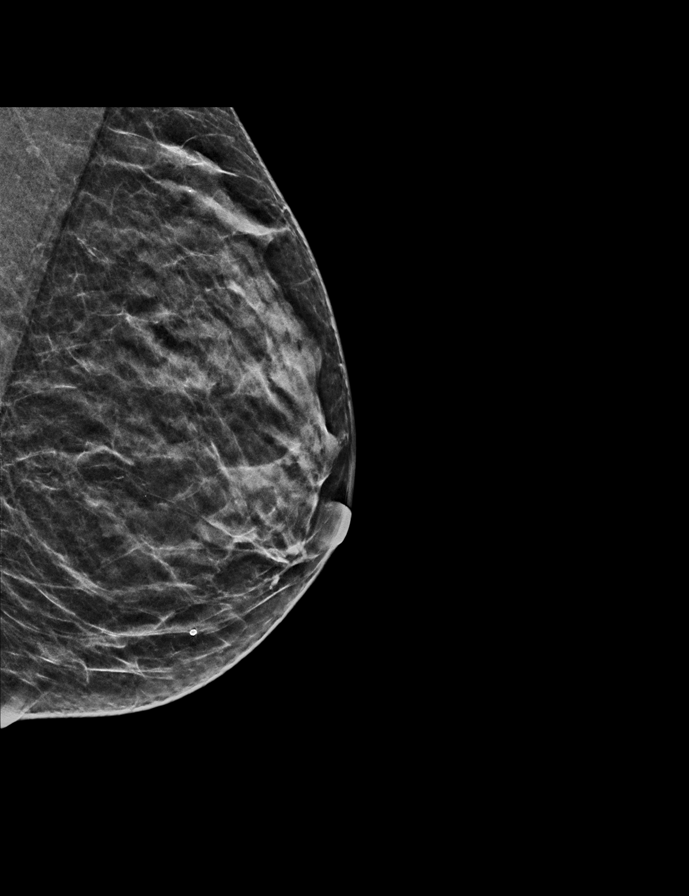

[R MLO synth-2D]
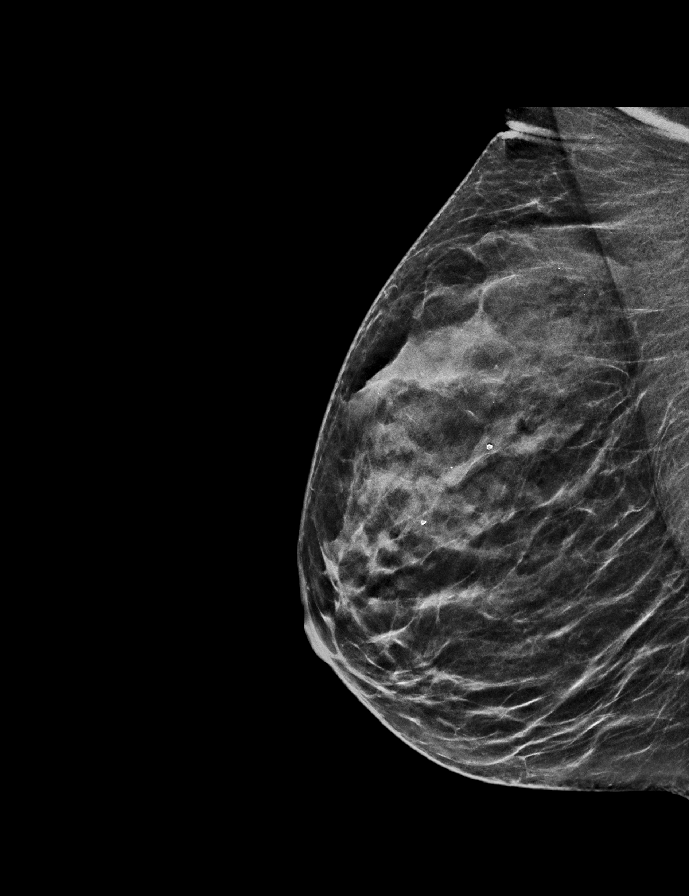

[L CC synth-2D]
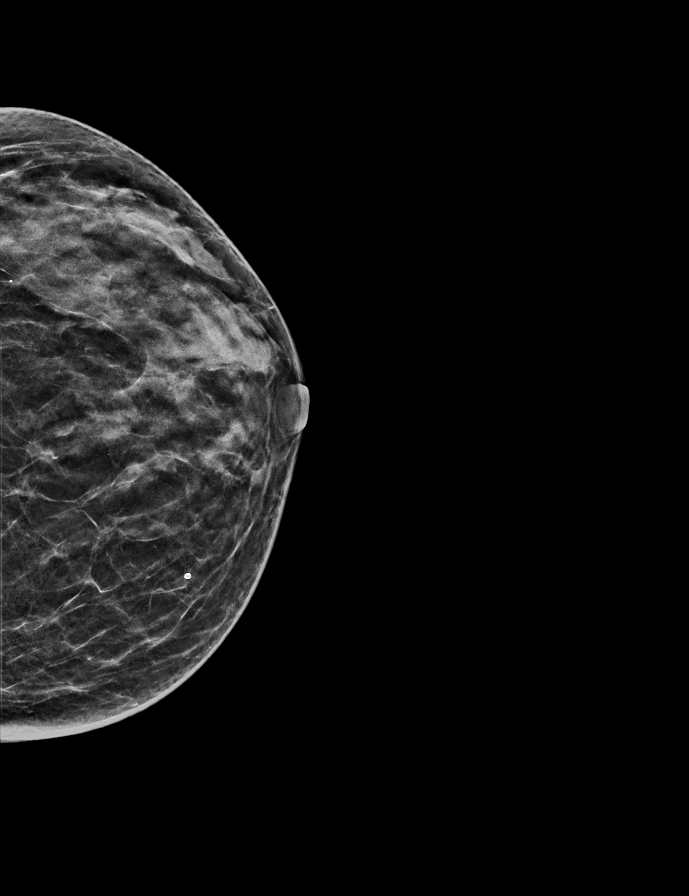

[R MLO tomo · 2 of 43 frames shown]
[frame 14/43]
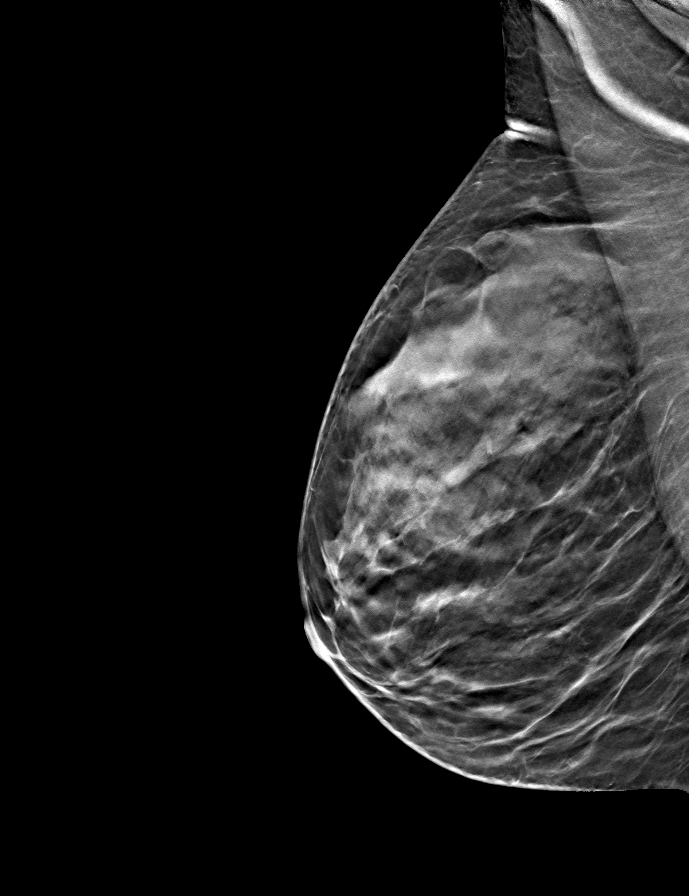
[frame 22/43]
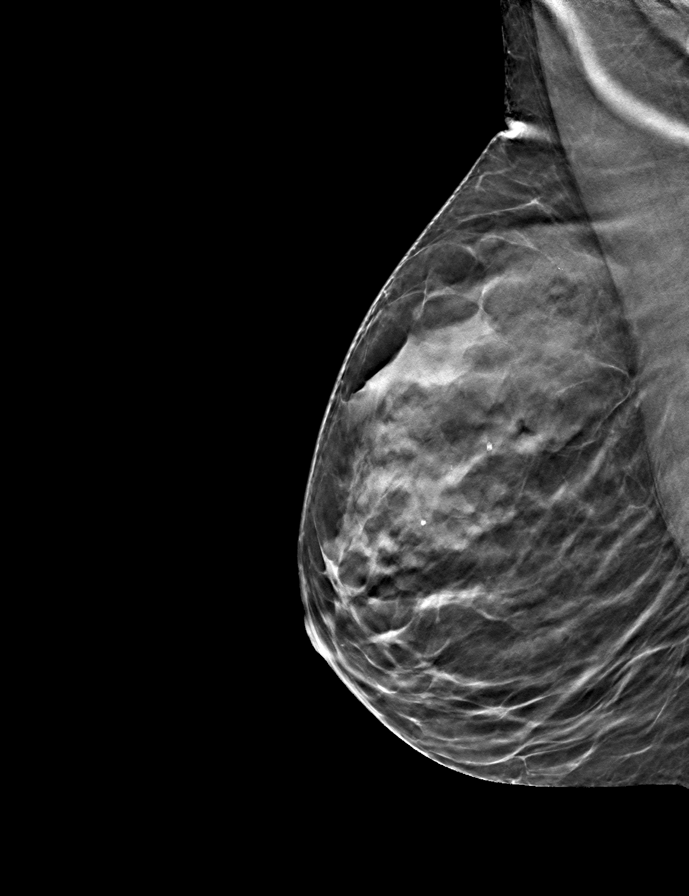

[R CC tomo · tomo slice 23/45.0]
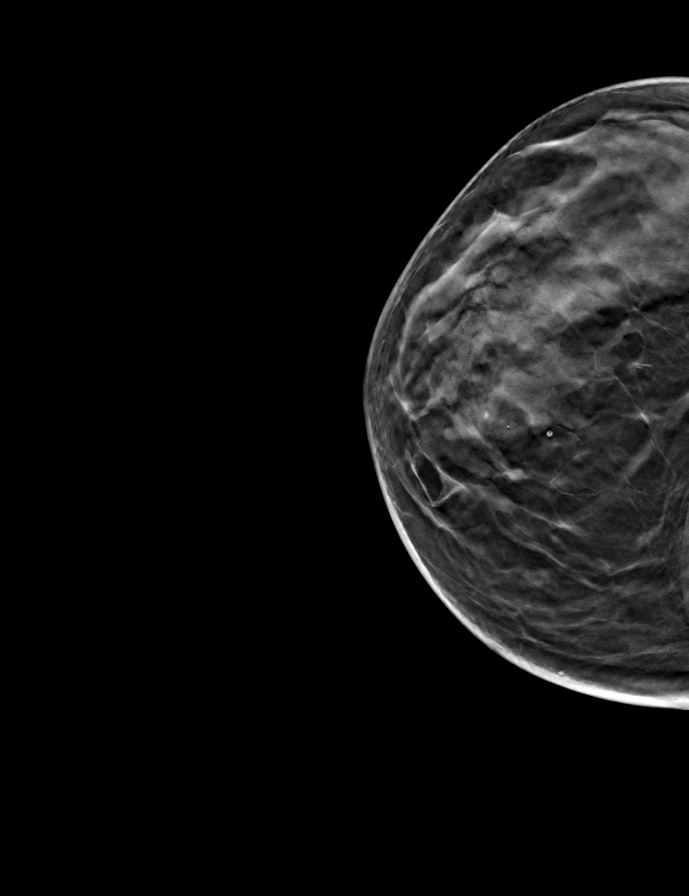

[L MLO tomo · tomo slice 20/39.0]
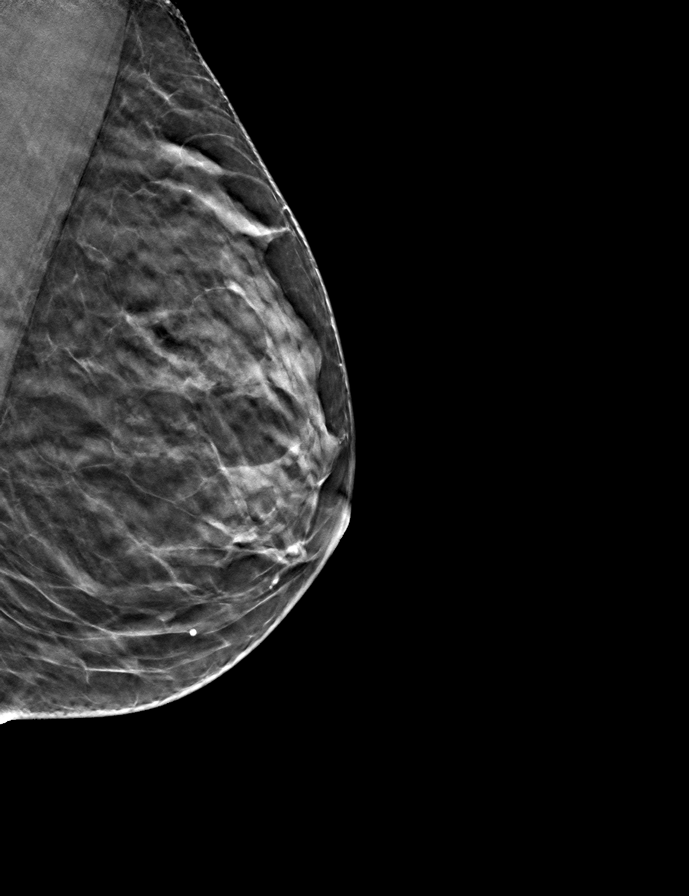

[L CC tomo · tomo slice 21/41.0]
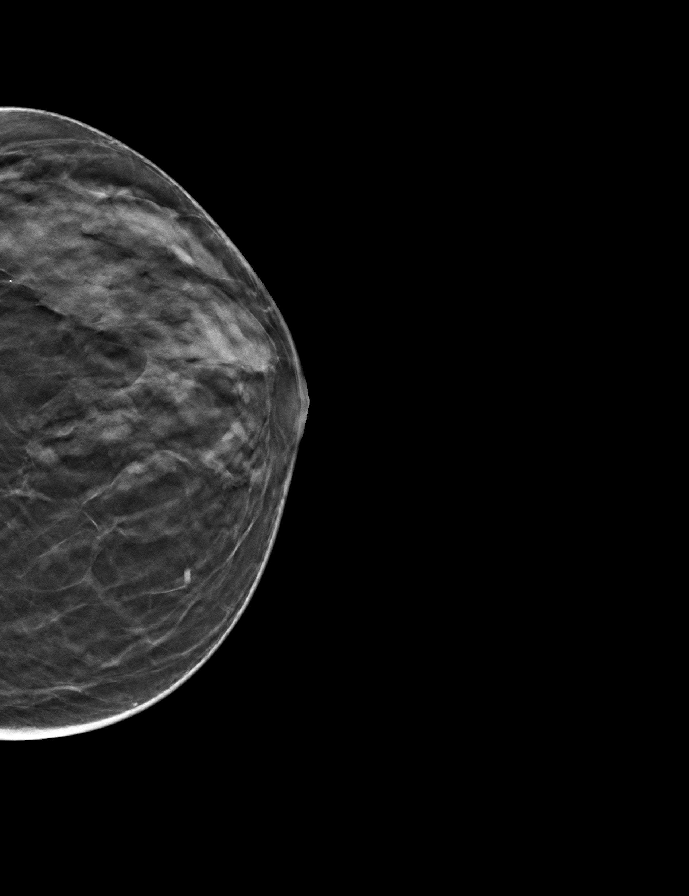

[9 of 24 positions shown; findings below may reference images not displayed]

ACR Breast Density Category c: The breast tissue is heterogeneously
dense, which may obscure small masses.
FINDINGS: There are no findings suspicious for malignancy. Images were
processed with CAD.
IMPRESSION: No mammographic evidence of malignancy. A result letter of this
screening mammogram will be mailed directly to the patient.

RECOMMENDATION:
Screening mammogram in one year. (Code:FT-U-LHB)

BI-RADS CATEGORY  1: Negative.

## 2021-05-17 ENCOUNTER — Other Ambulatory Visit (HOSPITAL_COMMUNITY): Payer: Self-pay

## 2021-05-17 MED ORDER — DILTIAZEM HCL ER COATED BEADS 240 MG PO TB24
240.0000 mg | ORAL_TABLET | Freq: Every day | ORAL | 4 refills | Status: DC
  Filled 2021-05-17: qty 90, 90d supply, fill #0
  Filled 2021-08-16: qty 90, 90d supply, fill #1
  Filled 2021-11-15: qty 90, 90d supply, fill #2

## 2021-05-17 MED FILL — Chlorthalidone Tab 25 MG: ORAL | 90 days supply | Qty: 90 | Fill #1 | Status: AC

## 2021-05-18 ENCOUNTER — Other Ambulatory Visit (HOSPITAL_COMMUNITY): Payer: Self-pay

## 2021-05-30 DIAGNOSIS — L820 Inflamed seborrheic keratosis: Secondary | ICD-10-CM | POA: Diagnosis not present

## 2021-08-16 ENCOUNTER — Other Ambulatory Visit (HOSPITAL_COMMUNITY): Payer: Self-pay

## 2021-08-16 MED FILL — Chlorthalidone Tab 25 MG: ORAL | 90 days supply | Qty: 90 | Fill #2 | Status: AC

## 2021-11-15 ENCOUNTER — Other Ambulatory Visit (HOSPITAL_COMMUNITY): Payer: Self-pay

## 2021-11-15 MED ORDER — CHLORTHALIDONE 25 MG PO TABS
25.0000 mg | ORAL_TABLET | Freq: Every morning | ORAL | 4 refills | Status: DC
  Filled 2021-11-15: qty 90, 90d supply, fill #0
  Filled 2022-02-09: qty 90, 90d supply, fill #1
  Filled 2022-05-09: qty 90, 90d supply, fill #2
  Filled 2022-08-07: qty 90, 90d supply, fill #3
  Filled 2022-10-25 (×2): qty 90, 90d supply, fill #4

## 2022-01-23 ENCOUNTER — Other Ambulatory Visit (HOSPITAL_COMMUNITY): Payer: Self-pay

## 2022-02-09 ENCOUNTER — Other Ambulatory Visit (HOSPITAL_COMMUNITY): Payer: Self-pay

## 2022-02-16 ENCOUNTER — Other Ambulatory Visit (HOSPITAL_COMMUNITY): Payer: Self-pay

## 2022-02-16 MED ORDER — DILTIAZEM HCL ER 240 MG PO TB24
240.0000 mg | ORAL_TABLET | Freq: Every day | ORAL | 4 refills | Status: DC
  Filled 2022-02-16: qty 90, 90d supply, fill #0

## 2022-02-17 ENCOUNTER — Other Ambulatory Visit (HOSPITAL_COMMUNITY): Payer: Self-pay

## 2022-02-17 MED ORDER — DILTIAZEM HCL ER COATED BEADS 240 MG PO CP24
240.0000 mg | ORAL_CAPSULE | Freq: Every day | ORAL | 4 refills | Status: DC
  Filled 2022-02-17: qty 90, 90d supply, fill #0
  Filled 2022-05-17: qty 90, 90d supply, fill #1
  Filled 2022-08-07: qty 90, 90d supply, fill #2
  Filled 2022-10-25 (×2): qty 90, 90d supply, fill #3
  Filled 2023-01-30: qty 90, 90d supply, fill #4
  Filled 2023-01-30: qty 90, 90d supply, fill #0

## 2022-05-09 ENCOUNTER — Other Ambulatory Visit (HOSPITAL_COMMUNITY): Payer: Self-pay

## 2022-05-09 MED ORDER — ATORVASTATIN CALCIUM 10 MG PO TABS
10.0000 mg | ORAL_TABLET | Freq: Every day | ORAL | 4 refills | Status: DC
  Filled 2022-05-09: qty 90, 90d supply, fill #0

## 2022-05-17 ENCOUNTER — Other Ambulatory Visit (HOSPITAL_COMMUNITY): Payer: Self-pay

## 2022-05-19 ENCOUNTER — Emergency Department (HOSPITAL_COMMUNITY): Payer: 59

## 2022-05-19 ENCOUNTER — Encounter (HOSPITAL_COMMUNITY): Payer: Self-pay | Admitting: *Deleted

## 2022-05-19 ENCOUNTER — Inpatient Hospital Stay (HOSPITAL_COMMUNITY)
Admission: EM | Admit: 2022-05-19 | Discharge: 2022-05-22 | DRG: 177 | Disposition: A | Discharge status: Home / Self Care | Payer: 59 | Attending: Internal Medicine | Admitting: Internal Medicine

## 2022-05-19 ENCOUNTER — Inpatient Hospital Stay (HOSPITAL_COMMUNITY): Payer: 59

## 2022-05-19 ENCOUNTER — Other Ambulatory Visit: Payer: Self-pay

## 2022-05-19 DIAGNOSIS — J1282 Pneumonia due to coronavirus disease 2019: Secondary | ICD-10-CM | POA: Diagnosis not present

## 2022-05-19 DIAGNOSIS — Z79899 Other long term (current) drug therapy: Secondary | ICD-10-CM

## 2022-05-19 DIAGNOSIS — E860 Dehydration: Secondary | ICD-10-CM | POA: Diagnosis present

## 2022-05-19 DIAGNOSIS — K219 Gastro-esophageal reflux disease without esophagitis: Secondary | ICD-10-CM | POA: Diagnosis not present

## 2022-05-19 DIAGNOSIS — R918 Other nonspecific abnormal finding of lung field: Secondary | ICD-10-CM | POA: Diagnosis not present

## 2022-05-19 DIAGNOSIS — Z885 Allergy status to narcotic agent status: Secondary | ICD-10-CM

## 2022-05-19 DIAGNOSIS — E46 Unspecified protein-calorie malnutrition: Secondary | ICD-10-CM | POA: Diagnosis present

## 2022-05-19 DIAGNOSIS — Z888 Allergy status to other drugs, medicaments and biological substances status: Secondary | ICD-10-CM | POA: Diagnosis not present

## 2022-05-19 DIAGNOSIS — Z681 Body mass index (BMI) 19 or less, adult: Secondary | ICD-10-CM | POA: Diagnosis not present

## 2022-05-19 DIAGNOSIS — E876 Hypokalemia: Secondary | ICD-10-CM | POA: Diagnosis not present

## 2022-05-19 DIAGNOSIS — Z87891 Personal history of nicotine dependence: Secondary | ICD-10-CM | POA: Diagnosis not present

## 2022-05-19 DIAGNOSIS — J9601 Acute respiratory failure with hypoxia: Secondary | ICD-10-CM | POA: Diagnosis present

## 2022-05-19 DIAGNOSIS — Z90710 Acquired absence of both cervix and uterus: Secondary | ICD-10-CM

## 2022-05-19 DIAGNOSIS — E785 Hyperlipidemia, unspecified: Secondary | ICD-10-CM | POA: Diagnosis present

## 2022-05-19 DIAGNOSIS — J439 Emphysema, unspecified: Secondary | ICD-10-CM | POA: Diagnosis not present

## 2022-05-19 DIAGNOSIS — U071 COVID-19: Principal | ICD-10-CM | POA: Diagnosis present

## 2022-05-19 DIAGNOSIS — I700 Atherosclerosis of aorta: Secondary | ICD-10-CM | POA: Diagnosis not present

## 2022-05-19 DIAGNOSIS — I10 Essential (primary) hypertension: Secondary | ICD-10-CM | POA: Diagnosis not present

## 2022-05-19 DIAGNOSIS — R0602 Shortness of breath: Secondary | ICD-10-CM | POA: Diagnosis not present

## 2022-05-19 DIAGNOSIS — R911 Solitary pulmonary nodule: Secondary | ICD-10-CM | POA: Diagnosis not present

## 2022-05-19 LAB — URINALYSIS, ROUTINE W REFLEX MICROSCOPIC
Bacteria, UA: NONE SEEN
Bilirubin Urine: NEGATIVE
Glucose, UA: NEGATIVE mg/dL
Ketones, ur: 80 mg/dL — AB
Leukocytes,Ua: NEGATIVE
Nitrite: NEGATIVE
Protein, ur: 30 mg/dL — AB
Specific Gravity, Urine: 1.016 (ref 1.005–1.030)
pH: 5 (ref 5.0–8.0)

## 2022-05-19 LAB — BLOOD GAS, VENOUS
Acid-Base Excess: 9.9 mmol/L — ABNORMAL HIGH (ref 0.0–2.0)
Bicarbonate: 36 mmol/L — ABNORMAL HIGH (ref 20.0–28.0)
Drawn by: 27160
O2 Saturation: 31.4 %
Patient temperature: 36.8
pCO2, Ven: 53 mmHg (ref 44–60)
pH, Ven: 7.44 — ABNORMAL HIGH (ref 7.25–7.43)
pO2, Ven: <31 mmHg — CL (ref 32–45)

## 2022-05-19 LAB — CBC WITH DIFFERENTIAL/PLATELET
Abs Immature Granulocytes: 0.01 10*3/uL (ref 0.00–0.07)
Basophils Absolute: 0 10*3/uL (ref 0.0–0.1)
Basophils Relative: 0 %
Eosinophils Absolute: 0 10*3/uL (ref 0.0–0.5)
Eosinophils Relative: 0 %
HCT: 43.5 % (ref 36.0–46.0)
Hemoglobin: 15.5 g/dL — ABNORMAL HIGH (ref 12.0–15.0)
Immature Granulocytes: 0 %
Lymphocytes Relative: 11 %
Lymphs Abs: 0.7 10*3/uL (ref 0.7–4.0)
MCH: 30.9 pg (ref 26.0–34.0)
MCHC: 35.6 g/dL (ref 30.0–36.0)
MCV: 86.8 fL (ref 80.0–100.0)
Monocytes Absolute: 0.5 10*3/uL (ref 0.1–1.0)
Monocytes Relative: 9 %
Neutro Abs: 5 10*3/uL (ref 1.7–7.7)
Neutrophils Relative %: 80 %
Platelets: 122 10*3/uL — ABNORMAL LOW (ref 150–400)
RBC: 5.01 MIL/uL (ref 3.87–5.11)
RDW: 12.2 % (ref 11.5–15.5)
WBC: 6.2 10*3/uL (ref 4.0–10.5)
nRBC: 0 % (ref 0.0–0.2)

## 2022-05-19 LAB — RESP PANEL BY RT-PCR (FLU A&B, COVID) ARPGX2
Influenza A by PCR: NEGATIVE
Influenza B by PCR: NEGATIVE
SARS Coronavirus 2 by RT PCR: POSITIVE — AB

## 2022-05-19 LAB — TRIGLYCERIDES: Triglycerides: 72 mg/dL (ref <150)

## 2022-05-19 LAB — BASIC METABOLIC PANEL
Anion gap: 16 — ABNORMAL HIGH (ref 5–15)
BUN: 22 mg/dL (ref 8–23)
CO2: 29 mmol/L (ref 22–32)
Calcium: 9 mg/dL (ref 8.9–10.3)
Chloride: 90 mmol/L — ABNORMAL LOW (ref 98–111)
Creatinine, Ser: 0.67 mg/dL (ref 0.44–1.00)
GFR, Estimated: >60 mL/min (ref >60)
Glucose, Bld: 112 mg/dL — ABNORMAL HIGH (ref 70–99)
Potassium: 2.5 mmol/L — CL (ref 3.5–5.1)
Sodium: 135 mmol/L (ref 135–145)

## 2022-05-19 LAB — FERRITIN: Ferritin: 361 ng/mL — ABNORMAL HIGH (ref 11–307)

## 2022-05-19 LAB — PROCALCITONIN: Procalcitonin: <0.1 ng/mL

## 2022-05-19 LAB — LACTIC ACID, PLASMA
Lactic Acid, Venous: 1.4 mmol/L (ref 0.5–1.9)
Lactic Acid, Venous: 1.4 mmol/L (ref 0.5–1.9)

## 2022-05-19 LAB — HEPATIC FUNCTION PANEL
ALT: 22 U/L (ref 0–44)
AST: 33 U/L (ref 15–41)
Albumin: 3.9 g/dL (ref 3.5–5.0)
Alkaline Phosphatase: 51 U/L (ref 38–126)
Bilirubin, Direct: 0.2 mg/dL (ref 0.0–0.2)
Indirect Bilirubin: 1 mg/dL — ABNORMAL HIGH (ref 0.3–0.9)
Total Bilirubin: 1.2 mg/dL (ref 0.3–1.2)
Total Protein: 7.2 g/dL (ref 6.5–8.1)

## 2022-05-19 LAB — HIV ANTIBODY (ROUTINE TESTING W REFLEX): HIV Screen 4th Generation wRfx: NONREACTIVE

## 2022-05-19 LAB — D-DIMER, QUANTITATIVE: D-Dimer, Quant: 0.47 ug/mL-FEU (ref 0.00–0.50)

## 2022-05-19 LAB — C-REACTIVE PROTEIN: CRP: 5.1 mg/dL — ABNORMAL HIGH (ref <1.0)

## 2022-05-19 LAB — LACTATE DEHYDROGENASE: LDH: 191 U/L (ref 98–192)

## 2022-05-19 LAB — MAGNESIUM: Magnesium: 1.5 mg/dL — ABNORMAL LOW (ref 1.7–2.4)

## 2022-05-19 LAB — FIBRINOGEN: Fibrinogen: 490 mg/dL — ABNORMAL HIGH (ref 210–475)

## 2022-05-19 LAB — PHOSPHORUS: Phosphorus: 3.4 mg/dL (ref 2.5–4.6)

## 2022-05-19 MED ORDER — NIRMATRELVIR/RITONAVIR (PAXLOVID)TABLET: 3.0000 | ORAL_TABLET | Freq: Two times a day (BID) | ORAL | Status: DC

## 2022-05-19 MED ORDER — ACETAMINOPHEN 325 MG PO TABS: 650.0000 mg | ORAL_TABLET | Freq: Four times a day (QID) | ORAL | Status: DC | PRN

## 2022-05-19 MED ORDER — PANTOPRAZOLE SODIUM 40 MG PO TBEC
40.0000 mg | DELAYED_RELEASE_TABLET | Freq: Every day | ORAL | Status: DC
  Administered 2022-05-19 – 2022-05-22 (×4): 40 mg via ORAL
  Filled 2022-05-19 (×4): qty 1

## 2022-05-19 MED ORDER — GUAIFENESIN-DM 100-10 MG/5ML PO SYRP
10.0000 mL | ORAL_SOLUTION | ORAL | Status: DC | PRN
  Administered 2022-05-20: 10 mL via ORAL
  Filled 2022-05-19: qty 10

## 2022-05-19 MED ORDER — ONDANSETRON HCL 4 MG PO TABS: 4.0000 mg | ORAL_TABLET | Freq: Four times a day (QID) | ORAL | Status: DC | PRN

## 2022-05-19 MED ORDER — METHYLPREDNISOLONE SODIUM SUCC 40 MG IJ SOLR
0.5000 mg/kg | Freq: Two times a day (BID) | INTRAMUSCULAR | Status: AC
  Administered 2022-05-19 – 2022-05-22 (×6): 19.2 mg via INTRAVENOUS
  Filled 2022-05-19 (×6): qty 1

## 2022-05-19 MED ORDER — SODIUM CHLORIDE 0.9 % IV SOLN: INTRAVENOUS | Status: AC

## 2022-05-19 MED ORDER — ENSURE ENLIVE PO LIQD
237.0000 mL | Freq: Two times a day (BID) | ORAL | Status: DC
  Administered 2022-05-20 – 2022-05-22 (×4): 237 mL via ORAL

## 2022-05-19 MED ORDER — NIRMATRELVIR/RITONAVIR (PAXLOVID) TABLET (RENAL DOSING)
2.0000 | ORAL_TABLET | Freq: Two times a day (BID) | ORAL | Status: DC
  Administered 2022-05-19 – 2022-05-22 (×6): 2 via ORAL
  Filled 2022-05-19: qty 20

## 2022-05-19 MED ORDER — SODIUM CHLORIDE 0.9 % IV BOLUS
500.0000 mL | Freq: Once | INTRAVENOUS | Status: AC
  Administered 2022-05-19: 500 mL via INTRAVENOUS

## 2022-05-19 MED ORDER — IPRATROPIUM-ALBUTEROL 20-100 MCG/ACT IN AERS
1.0000 | INHALATION_SPRAY | Freq: Four times a day (QID) | RESPIRATORY_TRACT | Status: DC
  Administered 2022-05-19 – 2022-05-20 (×5): 1 via RESPIRATORY_TRACT
  Filled 2022-05-19: qty 4

## 2022-05-19 MED ORDER — POTASSIUM CHLORIDE 10 MEQ/100ML IV SOLN
10.0000 meq | INTRAVENOUS | Status: AC
  Administered 2022-05-19 (×3): 10 meq via INTRAVENOUS
  Filled 2022-05-19 (×3): qty 100

## 2022-05-19 MED ORDER — HYDROCOD POLI-CHLORPHE POLI ER 10-8 MG/5ML PO SUER
5.0000 mL | Freq: Two times a day (BID) | ORAL | Status: DC | PRN
  Administered 2022-05-19: 5 mL via ORAL
  Filled 2022-05-19: qty 5

## 2022-05-19 MED ORDER — ENOXAPARIN SODIUM 40 MG/0.4ML IJ SOSY
40.0000 mg | PREFILLED_SYRINGE | INTRAMUSCULAR | Status: DC
Start: 2022-05-19 — End: 2022-05-20
  Administered 2022-05-19: 40 mg via SUBCUTANEOUS
  Filled 2022-05-19: qty 0.4

## 2022-05-19 MED ORDER — VITAMIN C 500 MG PO TABS
500.0000 mg | ORAL_TABLET | Freq: Every day | ORAL | Status: DC
  Administered 2022-05-19 – 2022-05-22 (×4): 500 mg via ORAL
  Filled 2022-05-19 (×4): qty 1

## 2022-05-19 MED ORDER — ONDANSETRON HCL 4 MG/2ML IJ SOLN: 4.0000 mg | Freq: Four times a day (QID) | INTRAMUSCULAR | Status: DC | PRN

## 2022-05-19 MED ORDER — ZINC SULFATE 220 (50 ZN) MG PO CAPS
220.0000 mg | ORAL_CAPSULE | Freq: Every day | ORAL | Status: DC
  Administered 2022-05-19 – 2022-05-22 (×4): 220 mg via ORAL
  Filled 2022-05-19 (×4): qty 1

## 2022-05-19 MED ORDER — POTASSIUM CHLORIDE CRYS ER 20 MEQ PO TBCR
40.0000 meq | EXTENDED_RELEASE_TABLET | Freq: Once | ORAL | Status: AC
  Administered 2022-05-19: 40 meq via ORAL
  Filled 2022-05-19: qty 2

## 2022-05-19 MED ORDER — MAGNESIUM SULFATE 2 GM/50ML IV SOLN
2.0000 g | Freq: Once | INTRAVENOUS | Status: AC
  Administered 2022-05-19: 2 g via INTRAVENOUS
  Filled 2022-05-19: qty 50

## 2022-05-19 MED ORDER — PREDNISONE 20 MG PO TABS
50.0000 mg | ORAL_TABLET | Freq: Every day | ORAL | Status: DC
  Administered 2022-05-22: 50 mg via ORAL
  Filled 2022-05-19: qty 1

## 2022-05-19 NOTE — Assessment & Plan Note (Signed)
-   Will replete electrolytes and follow trend.

## 2022-05-19 NOTE — Assessment & Plan Note (Addendum)
-  Chronically on a statin -Given initiation of Paxlovid for COVID will hold Lipitor (given medication reactions/interactions). -Patient will be safe to resume the use of a statin on 05/25/2022. -Heart healthy diet discussed with patient.

## 2022-05-19 NOTE — ED Provider Notes (Signed)
[facility] EMERGENCY DEPARTMENT Provider Note   CSN: [id number] Arrival date & time: 05/19/22  0855     History  Chief Complaint  Patient presents with   Shortness of Breath    Sophia Mccoy is a 66 y.o. female.   Shortness of Breath Associated symptoms: cough   Associated symptoms: no abdominal pain, no chest pain, no fever, no headaches, no rash, no vomiting and no wheezing        Sophia Mccoy is a 66 y.o. female with past medical history of hypertension who presents to the Emergency Department complaining of shortness of breath x2 to 3 days.  States she was diagnosed with COVID 6 days ago.  Had a positive home test.  She has had 1 vaccination and one booster.  No previous COVID infections.  States she was essentially asymptomatic at time of the diagnosis.  Developed slight cough and mild nasal congestion with gradually worsening shortness of breath.  She was at work this morning when her shortness of breath worsened.  She is employed here at [employer].  Her O2 sat was checked and was 85% on room air.  She states it has been as low as 80% on room air.  She feels short of breath at rest and her symptoms worsen with exertion.  She does not have a supplemental oxygen requirement at baseline.  She is a non-smoker.  No history of cardiac disease.  She states that she was retested for COVID 4 days ago and was negative.  Home Medications Prior to Admission medications   Medication Sig Start Date End Date Taking? Authorizing Provider  Ascorbic Acid (VITAMIN C) 1000 MG tablet Take 1,000 mg by mouth daily.    [provider]  aspirin 81 MG tablet Take 81 mg by mouth daily.    [provider]  atorvastatin (LIPITOR) 10 MG tablet Take 10 mg by mouth daily.    [provider]  atorvastatin (LIPITOR) 10 MG tablet TAKE 1 TABLET BY MOUTH DAILY FOR HIGH CHOLESTEROL 11/19/19 11/18/20  Asencion Noble, MD  atorvastatin (LIPITOR) 10 MG tablet Take 1 tablet (10 mg total) by mouth  daily for high cholesterol 05/09/22     chlorthalidone (HYGROTON) 25 MG tablet TAKE 1 TABLET BY MOUTH EVERY MORNING 08/19/20 08/19/21  Asencion Noble, MD  chlorthalidone (HYGROTON) 25 MG tablet Take 1 tablet (25 mg total) by mouth every morning. 11/15/21     cholecalciferol (VITAMIN D) 1000 UNITS tablet Take 1,000 Units by mouth daily.    [provider]  diltiazem (CARDIZEM CD) 240 MG 24 hr capsule Take 1 capsule (240 mg total) by mouth daily. 02/17/22     diltiazem (CARDIZEM LA) 240 MG 24 hr tablet TAKE 1 TABLET BY MOUTH ONCE DAILY 03/04/20 05/17/21  Asencion Noble, MD  lisinopril (PRINIVIL,ZESTRIL) 20 MG tablet Take 20 mg by mouth daily.    [provider]  predniSONE (DELTASONE) 50 MG tablet Take 1 tablet (50 mg total) by mouth daily. 5/78/46   Delora Fuel, MD  vitamin E 1000 UNIT capsule Take 1,000 Units by mouth daily.    [provider]  diltiazem (CARDIZEM LA) 240 MG 24 hr tablet Take 1 tablet (240 mg total) by mouth daily. 02/16/22 02/17/22        Allergies    Codeine and Lisinopril    Review of Systems   Review of Systems  Constitutional:  Positive for appetite change. Negative for chills and fever.  HENT:  Positive for  congestion. Negative for trouble swallowing.   Respiratory:  Positive for cough and shortness of breath. Negative for wheezing.   Cardiovascular:  Negative for chest pain and leg swelling.  Gastrointestinal:  Negative for abdominal pain, diarrhea, nausea and vomiting.  Genitourinary:  Negative for difficulty urinating, dysuria and flank pain.  Musculoskeletal:  Negative for arthralgias and myalgias.  Skin:  Negative for rash.  Neurological:  Negative for dizziness, syncope, weakness, light-headedness, numbness and headaches.  Psychiatric/Behavioral:  Negative for confusion.     Physical Exam Updated Vital Signs BP 115/60 (BP Location: Right Arm)   Pulse 66   Temp 98.3 F (36.8 C) (Axillary)   Resp 19   Ht '5\' 4"'$  (1.626 m)   Wt 38.3 kg   SpO2 95%    BMI 14.49 kg/m  Physical Exam Vitals and nursing note reviewed.  Constitutional:      Appearance: She is ill-appearing.  HENT:     Mouth/Throat:     Mouth: Mucous membranes are dry.  Eyes:     Conjunctiva/sclera: Conjunctivae normal.  Cardiovascular:     Rate and Rhythm: Normal rate and regular rhythm.     Pulses: Normal pulses.  Pulmonary:     Breath sounds: No wheezing, rhonchi or rales.     Comments: Patient has increased work of breathing, currently on 3 L O2 by nasal cannula.  Lung sounds diminished bilaterally without wheezing or rales. Abdominal:     General: There is no distension.     Palpations: Abdomen is soft.     Tenderness: There is no abdominal tenderness.  Musculoskeletal:        General: Normal range of motion.     Right lower leg: No edema.     Left lower leg: No edema.  Skin:    General: Skin is warm.     Capillary Refill: Capillary refill takes less than 2 seconds.  Neurological:     General: No focal deficit present.     Mental Status: She is alert.     Sensory: No sensory deficit.     Motor: No weakness.     ED Results / Procedures / Treatments   Labs (all labs ordered are listed, but only abnormal results are displayed) Labs Reviewed  RESP PANEL BY RT-PCR (FLU A&B, COVID) ARPGX2 - Abnormal; Notable for the following components:      Result Value   SARS Coronavirus 2 by RT PCR POSITIVE (*)    All other components within normal limits  BASIC METABOLIC PANEL - Abnormal; Notable for the following components:   Potassium 2.5 (*)    Chloride 90 (*)    Glucose, Bld 112 (*)    Anion gap 16 (*)    All other components within normal limits  CBC WITH DIFFERENTIAL/PLATELET - Abnormal; Notable for the following components:   Hemoglobin 15.5 (*)    Platelets 122 (*)    All other components within normal limits  BLOOD GAS, VENOUS - Abnormal; Notable for the following components:   pH, Ven 7.44 (*)    pO2, Ven <31 (*)    Bicarbonate 36.0 (*)     Acid-Base Excess 9.9 (*)    All other components within normal limits  CULTURE, BLOOD (ROUTINE X 2)  CULTURE, BLOOD (ROUTINE X 2)  EXPECTORATED SPUTUM ASSESSMENT W GRAM STAIN, RFLX TO RESP C  LACTIC ACID, PLASMA  LACTIC ACID, PLASMA  D-DIMER, QUANTITATIVE  PROCALCITONIN  LACTATE DEHYDROGENASE  FERRITIN  TRIGLYCERIDES  FIBRINOGEN  C-REACTIVE PROTEIN  HEPATIC FUNCTION PANEL  MAGNESIUM  PHOSPHORUS  HIV ANTIBODY (ROUTINE TESTING W REFLEX)  CBC  CREATININE, SERUM  C-REACTIVE PROTEIN  D-DIMER, QUANTITATIVE  FERRITIN  PROCALCITONIN    EKG EKG Interpretation  Date/Time:  Friday May 19 2022 10:57:23 EDT Ventricular Rate:  146 PR Interval:  139 QRS Duration: 124 QT Interval:  218 QTC Calculation: 280 R Axis:   83 Text Interpretation: Sinus tachycardia Paired ventricular premature complexes Left bundle branch block Artifact in lead(s) II aVR aVF V1 V2 V3 V4 V5 V6 Confirmed by Octaviano Glow [phone]) on 05/19/2022 11:11:24 AM  Radiology DG Chest Port 1 View  Result Date: 05/19/2022 CLINICAL DATA:  Shortness of breath. EXAM: PORTABLE CHEST 1 VIEW COMPARISON:  None Available. FINDINGS: The heart size and mediastinal contours are within normal limits. Hyperexpansion of the lungs is noted. Probable scarring and emphysematous disease is noted throughout both lungs. Possible right apical nodular density is noted. The visualized skeletal structures are unremarkable. IMPRESSION: Hyperexpansion of the lungs is noted with probable scarring and emphysematous disease noted. Possible right apical nodular density is noted; CT scan is recommended for further evaluation. Aortic Atherosclerosis (ICD10-I70.0) and Emphysema (ICD10-J43.9). Electronically Signed   By: Marijo Conception M.D.   On: 05/19/2022 10:17    Procedures Procedures    Medications Ordered in ED Medications  potassium chloride 10 mEq in 100 mL IVPB (10 mEq Intravenous New Bag/Given 05/19/22 1225)  enoxaparin (LOVENOX) injection  40 mg (has no administration in time range)  0.9 %  sodium chloride infusion (has no administration in time range)  nirmatrelvir/ritonavir EUA (PAXLOVID) 3 tablet (has no administration in time range)  Ipratropium-Albuterol (COMBIVENT) respimat 1 puff (has no administration in time range)  methylPREDNISolone sodium succinate (SOLU-MEDROL) 40 mg/mL injection 19.2 mg (has no administration in time range)    Followed by  predniSONE (DELTASONE) tablet 50 mg (has no administration in time range)  guaiFENesin-dextromethorphan (ROBITUSSIN DM) 100-10 MG/5ML syrup 10 mL (has no administration in time range)  chlorpheniramine-HYDROcodone (TUSSIONEX) 10-8 MG/5ML suspension 5 mL (has no administration in time range)  ascorbic acid (VITAMIN C) tablet 500 mg (has no administration in time range)  zinc sulfate capsule 220 mg (has no administration in time range)  acetaminophen (TYLENOL) tablet 650 mg (has no administration in time range)  ondansetron (ZOFRAN) tablet 4 mg (has no administration in time range)    Or  ondansetron (ZOFRAN) injection 4 mg (has no administration in time range)  sodium chloride 0.9 % bolus 500 mL (0 mLs Intravenous Stopped 05/19/22 1137)  potassium chloride SA (KLOR-CON M) CR tablet 40 mEq (40 mEq Oral Given 05/19/22 1122)    ED Course/ Medical Decision Making/ A&P                           Medical Decision Making Patient here for evaluation of shortness of breath.  Diagnosed with COVID 6 days ago.  States she had a negative COVID test 4 days ago.  Developed slight cough, nasal congestion and gradually worsening shortness of breath 2 to 3 days ago.  On arrival, she was noted to have O2 sat of 85% on room air.  She was placed on 3 L O2 by nasal cannula and currently O2 sats in the mid 90s.  On exam, despite O2 she still has increased work of breathing.  Her lung sounds are diminished bilaterally.  I do not appreciate any rales or wheezing.  No peripheral edema on  exam.  Differential  diagnosis would include but not limited to COVID, pneumonia, CHF, acute respiratory failure, viral illness.  Amount and/or Complexity of Data Reviewed Labs: ordered.    Details: Labs interpreted by me show no evidence of leukocytosis, slightly thrombocytopenic platelets 122.  Chemistries show potassium of 2.5.  Venous blood gas shows pH of 7.4, PO2 less than 31 COVID test is positive today.  Laboratory markers are pending Radiology: ordered.    Details: Chest x-ray shows hyperexpansion of the lungs and right apical nodular density noted and CT of the chest recommended for further evaluation ECG/medicine tests: ordered.    Details: EKG shows sinus tachycardia with artifact present.  On cardiac monitoring patient is slightly bradycardic with heart rate in the 50s.  EKG reviewed by Dr. Langston Masker Discussion of management or test interpretation with external provider(s): Patient here with hypoxia secondary to COVID.  Also noted to be hypokalemic.  Agreeable to hospital admission  Discussed findings with [facility] hospitalist, Dr. Dyann Kief who is agreeable to admit.  Risk Prescription drug management. Decision regarding hospitalization.           Final Clinical Impression(s) / ED Diagnoses Final diagnoses:  Acute respiratory failure with hypoxia ([place])  COVID  Hypokalemia    Rx / DC Orders ED Discharge Orders     None         Kem Parkinson, PA-C 05/19/22 1250    Wyvonnia Dusky, MD 05/19/22 [phone]

## 2022-05-19 NOTE — ED Notes (Signed)
Patient labs resulted COVID positive proper precaution in place. Patient reports she tested positive 6 days ago with a  home test kit

## 2022-05-19 NOTE — H&P (Signed)
History and Physical    Patient: Sophia Mccoy CBJ:[id number] DOB: [birth year] DOA: 05/19/2022 DOS: the patient was seen and examined on 05/19/2022 PCP: Asencion Noble, MD  Patient coming from: Home  Chief Complaint:  Chief Complaint  Patient presents with   Shortness of Breath   HPI: Sophia Mccoy is a 66 y.o. female with medical history significant of hypertension, hyperlipidemia and gastroesophageal reflux disease; who presented to the emergency department secondary to shortness of breath, productive coughing spells and general malaise.  Patient reports recently being tested and positive for COVID in the last 5-6 days prior to admission.  Initially fairly asymptomatic; but with further progression of her symptoms to the point of being short winded with minimal activity.  Oxygen saturation at home demonstrated her levels down into the low 80s. Patient reports no nausea, no vomiting, no chest pain, no focal neurologic deficits, no dysuria, no hematuria, no melena, no hematochezia.  Patient expressed decreased oral intake.  In the ED patient was found hypoxic, slightly dehydrated, soft blood pressure, positive COVID by PCR and chest x-ray demonstrating right lung infiltrates.  Steroids, antiviral and bronchodilators were provided.  [facility] has been contacted to place patient in the hospital for further evaluation and management.  Review of Systems: As mentioned in the history of present illness. All other systems reviewed and are negative. Past Medical History:  Diagnosis Date   Hypertension    Past Surgical History:  Procedure Laterality Date   ABDOMINAL HYSTERECTOMY     TEMPOROMANDIBULAR JOINT SURGERY     TONSILLECTOMY     Social History:  reports that she has never smoked. She does not have any smokeless tobacco history on file. She reports that she does not drink alcohol and does not use drugs.  Allergies  Allergen Reactions   Codeine Nausea And Vomiting   Lisinopril Swelling    No  family history on file.  Prior to Admission medications   Medication Sig Start Date End Date Taking? Authorizing Provider  Ascorbic Acid (VITAMIN C) 1000 MG tablet Take 1,000 mg by mouth daily.   Yes [provider]  atorvastatin (LIPITOR) 10 MG tablet Take 1 tablet (10 mg total) by mouth daily for high cholesterol 05/09/22  Yes   chlorthalidone (HYGROTON) 25 MG tablet Take 1 tablet (25 mg total) by mouth every morning. 11/15/21  Yes   cholecalciferol (VITAMIN D) 1000 UNITS tablet Take 1,000 Units by mouth daily.   Yes [provider]  diltiazem (CARDIZEM CD) 240 MG 24 hr capsule Take 1 capsule (240 mg total) by mouth daily. 02/17/22  Yes   diltiazem (CARDIZEM LA) 240 MG 24 hr tablet Take 1 tablet (240 mg total) by mouth daily. 02/16/22 02/17/22      Physical Exam: Vitals:   05/19/22 1115 05/19/22 1129 05/19/22 1138 05/19/22 1513  BP:  96/63 (!) 112/55 (!) 113/53  Pulse: (!) 47 (!) 50  (!) 53  Resp: 15 (!) 21  20  Temp:  98.7 F (37.1 C)  97.7 F (36.5 C)  TempSrc:  Oral  Oral  SpO2: 96% 96%  95%  Weight:      Height:       General exam: Alert, awake, oriented x 3; requiring 2 L nasal cannula supplementation to keep saturations in the mid 90s.  No chest pain, no nausea, no vomiting. Respiratory system: Positive rhonchi bilaterally; no using accessory muscles.  Intermittent tachypnea with exertion appreciated. Cardiovascular system:RRR. No rubs or gallops; no JVD. Gastrointestinal system: Abdomen  is nondistended, soft and nontender. No organomegaly or masses felt. Normal bowel sounds heard. Central nervous system: Alert and oriented. No focal neurological deficits. Extremities: No cyanosis or clubbing. Skin: No petechiae. Psychiatry: Judgement and insight appear normal. Mood & affect appropriate.   Data Reviewed: COVID PCR positive D-dimer 0.47 Procalcitonin less than 0.10 Magnesium 1.5 LFTs demonstrating AST 33, ALT 22, alkaline phosphatase 51 and bilirubin  1.2 Phosphorus 3.4 Basic metabolic panel: Sodium 433, potassium 2.5, chloride 90, bicarb 29, BUN 22, creatinine 0.67 and anion gap 16 CBC: WBC 6.2, hemoglobin 15.5, platelet count 122 K CT chest: Right lower lobe and right middle lobe bronchitis occluded by debris's with scattered centrilobular nodules in the right lower lobe suggesting aspiration/infection.  There is an area of nodularity question on prior chest radiograph correlated with pleural parenchymal scaring.  There is bilateral solid pulmonary nodules largest measuring 4 mm; non contrast CT chest to be considered in 12 months if patient is high risk  Assessment and Plan: * Acute respiratory failure with hypoxia ([place]) - [place] to COVID-19 pneumonia -Patient has been started on Paxil.  Telemetry steroids -Follow inflammatory markers -Antitussive/mucolytic's, flutter valve and incentive spirometry has been ordered -Follow clinical response -Wean off oxygen supplementation as tolerated. -Continue as needed bronchodilators. -Vitamin C and zinc has been started.  Hypomagnesemia - Will replete electrolytes and follow trend.  Hypokalemia - Electrolytes will be repleted -Telemetry monitoring -No acute abnormalities on EKG. -Follow electrolytes trend/stability.  GERD (gastroesophageal reflux disease) - GERD/GI prophylaxis while receiving steroids will be achieved with the use of PPI.  Hyperlipidemia - Chronically on a statin -Given initiation of Paxlovid for COVID will hold Lipitor (given medication reactions/interactions).  Essential hypertension - Soft blood pressure appreciated at time of admission -Holding antihypertensive agents currently -Provide fluid resuscitation -Follow-up vital signs. -Heart healthy diet discussed with patient  Lungs nodularity: -CT chest follow-up in 12 months recommended -Patient with prior history of smoking  Underweight -Body mass index is 13.16 kg/m. -Good oral intake/nutrition  recommended -Will start Ensure twice a day -Follow recommendations by dietitian service.    Advance Care Planning:   Code Status: Full Code   Consults: None  Family Communication: Sister at bedside  Severity of Illness: The appropriate patient status for this patient is INPATIENT. Inpatient status is judged to be reasonable and necessary in order to provide the required intensity of service to ensure the patient's safety. The patient's presenting symptoms, physical exam findings, and initial radiographic and laboratory data in the context of their chronic comorbidities is felt to place them at high risk for further clinical deterioration. Furthermore, it is not anticipated that the patient will be medically stable for discharge from the hospital within 2 midnights of admission.   * I certify that at the point of admission it is my clinical judgment that the patient will require inpatient hospital care spanning beyond 2 midnights from the point of admission due to high intensity of service, high risk for further deterioration and high frequency of surveillance required.*  Author: Barton Dubois, MD 05/19/2022 6:02 PM  For on call review [email or web address].

## 2022-05-19 NOTE — Assessment & Plan Note (Addendum)
-  Secondary to COVID 19 pneumonia -Continue treatment with Paxlovid and tapering steroids (currently course and day 4 out of 5 of Paxil.)   -Continue to wean oxygen supplementation as tolerated -Continue antitussive/mucolytic agents -Continue bronchodilators as needed and also the use of flutter valve.   -Patient expressed feeling significantly better and would like to go home and continue treatment for her condition as an outpatient.  -Continue vitamin C and zinc

## 2022-05-19 NOTE — Assessment & Plan Note (Signed)
-   Repleted and within normal limits at discharge -Patient has been started on daily supplementation.

## 2022-05-19 NOTE — Assessment & Plan Note (Signed)
-  Blood pressure stable and rising -Will start resuming home antihypertensive agents -Continue to follow vital signs.

## 2022-05-19 NOTE — Assessment & Plan Note (Addendum)
-  GERD/GI prophylaxis while receiving steroids will be achieved with the use of PPI. -Patient reports no nausea or vomiting currently.

## 2022-05-19 NOTE — ED Triage Notes (Signed)
Pt c/o SOB and low oxygen sats over the past week since she was diagnosed with Covid last Saturday. Pt reports she has seen her O2 sat as low as 80%. Upon arrival to ED, pt's O2 sat 85% on RA. Pt reports she has tested again for Covid since Saturday and it was negative.

## 2022-05-20 DIAGNOSIS — U071 COVID-19: Secondary | ICD-10-CM | POA: Diagnosis not present

## 2022-05-20 DIAGNOSIS — J9601 Acute respiratory failure with hypoxia: Secondary | ICD-10-CM | POA: Diagnosis not present

## 2022-05-20 DIAGNOSIS — K219 Gastro-esophageal reflux disease without esophagitis: Secondary | ICD-10-CM | POA: Diagnosis not present

## 2022-05-20 DIAGNOSIS — I10 Essential (primary) hypertension: Secondary | ICD-10-CM | POA: Diagnosis not present

## 2022-05-20 LAB — CBC WITH DIFFERENTIAL/PLATELET
Abs Immature Granulocytes: 0.03 10*3/uL (ref 0.00–0.07)
Basophils Absolute: 0 10*3/uL (ref 0.0–0.1)
Basophils Relative: 0 %
Eosinophils Absolute: 0 10*3/uL (ref 0.0–0.5)
Eosinophils Relative: 0 %
HCT: 37.3 % (ref 36.0–46.0)
Hemoglobin: 13.1 g/dL (ref 12.0–15.0)
Immature Granulocytes: 1 %
Lymphocytes Relative: 11 %
Lymphs Abs: 0.5 10*3/uL — ABNORMAL LOW (ref 0.7–4.0)
MCH: 30.9 pg (ref 26.0–34.0)
MCHC: 35.1 g/dL (ref 30.0–36.0)
MCV: 88 fL (ref 80.0–100.0)
Monocytes Absolute: 0.2 10*3/uL (ref 0.1–1.0)
Monocytes Relative: 4 %
Neutro Abs: 3.4 10*3/uL (ref 1.7–7.7)
Neutrophils Relative %: 84 %
Platelets: 122 10*3/uL — ABNORMAL LOW (ref 150–400)
RBC: 4.24 MIL/uL (ref 3.87–5.11)
RDW: 12.2 % (ref 11.5–15.5)
WBC: 4.1 10*3/uL (ref 4.0–10.5)
nRBC: 0 % (ref 0.0–0.2)

## 2022-05-20 LAB — COMPREHENSIVE METABOLIC PANEL
ALT: 16 U/L (ref 0–44)
AST: 23 U/L (ref 15–41)
Albumin: 2.9 g/dL — ABNORMAL LOW (ref 3.5–5.0)
Alkaline Phosphatase: 38 U/L (ref 38–126)
Anion gap: 6 (ref 5–15)
BUN: 18 mg/dL (ref 8–23)
CO2: 30 mmol/L (ref 22–32)
Calcium: 8.2 mg/dL — ABNORMAL LOW (ref 8.9–10.3)
Chloride: 98 mmol/L (ref 98–111)
Creatinine, Ser: 0.38 mg/dL — ABNORMAL LOW (ref 0.44–1.00)
GFR, Estimated: >60 mL/min (ref >60)
Glucose, Bld: 146 mg/dL — ABNORMAL HIGH (ref 70–99)
Potassium: 3.1 mmol/L — ABNORMAL LOW (ref 3.5–5.1)
Sodium: 134 mmol/L — ABNORMAL LOW (ref 135–145)
Total Bilirubin: 0.9 mg/dL (ref 0.3–1.2)
Total Protein: 5.6 g/dL — ABNORMAL LOW (ref 6.5–8.1)

## 2022-05-20 LAB — C-REACTIVE PROTEIN: CRP: 3.6 mg/dL — ABNORMAL HIGH (ref <1.0)

## 2022-05-20 LAB — FERRITIN: Ferritin: 285 ng/mL (ref 11–307)

## 2022-05-20 LAB — MAGNESIUM: Magnesium: 1.9 mg/dL (ref 1.7–2.4)

## 2022-05-20 LAB — PHOSPHORUS: Phosphorus: 2.6 mg/dL (ref 2.5–4.6)

## 2022-05-20 MED ORDER — ENOXAPARIN SODIUM 30 MG/0.3ML IJ SOSY
30.0000 mg | PREFILLED_SYRINGE | INTRAMUSCULAR | Status: DC
  Administered 2022-05-20 – 2022-05-21 (×2): 30 mg via SUBCUTANEOUS
  Filled 2022-05-20 (×2): qty 0.3

## 2022-05-20 MED ORDER — POTASSIUM CHLORIDE CRYS ER 20 MEQ PO TBCR
40.0000 meq | EXTENDED_RELEASE_TABLET | ORAL | Status: AC
  Administered 2022-05-20 (×2): 40 meq via ORAL
  Filled 2022-05-20 (×2): qty 2

## 2022-05-20 MED ORDER — POTASSIUM CHLORIDE CRYS ER 20 MEQ PO TBCR
40.0000 meq | EXTENDED_RELEASE_TABLET | Freq: Every day | ORAL | Status: DC
Start: 2022-05-20 — End: 2022-05-22
  Administered 2022-05-20 – 2022-05-22 (×3): 40 meq via ORAL
  Filled 2022-05-20 (×3): qty 2

## 2022-05-20 NOTE — Progress Notes (Signed)
Progress Note   Patient: Sophia Mccoy GBT:[id number] DOB: [birth year] DOA: 05/19/2022     1 DOS: the patient was seen and examined on 05/20/2022   Brief hospital admission course: DOLOROS KWOLEK is a 66 y.o. female with medical history significant of hypertension, hyperlipidemia and gastroesophageal reflux disease; who presented to the emergency department secondary to shortness of breath, productive coughing spells and general malaise.  Patient reports recently being tested and positive for COVID in the last 5-6 days prior to admission.  Initially fairly asymptomatic; but with further progression of her symptoms to the point of being short winded with minimal activity.  Oxygen saturation at home demonstrated her levels down into the low 80s. Patient reports no nausea, no vomiting, no chest pain, no focal neurologic deficits, no dysuria, no hematuria, no melena, no hematochezia.   Patient expressed decreased oral intake.   In the ED patient was found hypoxic, slightly dehydrated, soft blood pressure, positive COVID by PCR and chest x-ray demonstrating right lung infiltrates.  Steroids, antiviral and bronchodilators were provided.  TRH has been contacted to place patient in the hospital for further evaluation and management.  Assessment and Plan: * Acute respiratory failure with hypoxia ([place]) -Secondary to COVID 19 pneumonia -Continue treatment with Paxlovid and his steroids (currently course and day 2 out of 5 of Paxil.)   -Continue to wean oxygen supplementation as tolerated -Continue antitussive/mucolytic agents -Continue bronchodilators as needed and also the use of flutter valve.   -Patient expressed feeling better overall. -Continue to follow inflammatory markers (trending down appropriately) -Continue vitamin C and zinc  Hypomagnesemia - Magnesium repleted and within normal limits: -Continue to follow electrolytes trend.  Hypokalemia - Potassium 3.1 -Continue further  repletion -Telemetry monitoring for another 24 hours.  GERD (gastroesophageal reflux disease) -GERD/GI prophylaxis while receiving steroids will be achieved with the use of PPI. -Patient reports no nausea or vomiting currently.  Hyperlipidemia -Chronically on a statin -Given initiation of Paxlovid for COVID will hold Lipitor (given medication reactions/interactions). -Heart healthy diet discussed with patient.  Essential hypertension -Blood pressure much better and within normal limits currently -Continue holding antihypertensive agents -Continue to follow vital signs.  Underweight/protein calorie malnutrition. -Patient reports no failure to thrive and expressed that she has always been thin and is not looking to lose weight. -Body mass index is 13.16 kg/m. -She understands a low level of her BMI and is open to pursued  the use of feeding supplements.    Subjective:  Reporting short winded sensation with activity, using 3 L nasal cannula supplementation to maintain saturation above 90% and experiencing intermittent productive coughing spells.  Physical Exam: Vitals:   05/20/22 0211 05/20/22 0611 05/20/22 0926 05/20/22 1425  BP: (!) 143/52 127/66    Pulse: (!) 51 (!) 47    Resp: 18 20    Temp: 97.8 F (36.6 C) 98 F (36.7 C)    TempSrc: Oral Oral    SpO2: 94% 92% 93% 92%  Weight:      Height:       General exam: Alert, awake, oriented x 3; reports feeling better and breathing easier; complaining of intermittent productive coughing spells and is still short winded sensation with activity.  3 L nasal cannula supplementation in place. Respiratory system: Positive rhonchi bilaterally; no using accessory muscles.  No wheezing appreciated on exam. Cardiovascular system:RRR. No rubs or gallops; no JVD. Gastrointestinal system: Abdomen is nondistended, soft and nontender. No organomegaly or masses felt. Normal bowel sounds heard. Central nervous  system: Alert and oriented. No  focal neurological deficits. Extremities: No cyanosis or clubbing. Skin: No petechiae. Psychiatry: Judgement and insight appear normal. Mood & affect appropriate.   Data Reviewed: CBC: WBCs 4.1, hemoglobin 13.1 and platelet count 122 K Comprehensive metabolic panel: Normal LFTs, sodium 134, potassium 3.1, chloride 98, bicarb 30, BUN 18 and creatinine 0.38 CRP:5.1>>>3.6  Family Communication: Sister at bedside.  Disposition: Status is: Inpatient Remains inpatient appropriate because: Continue treatment for COVID-pneumonia using IV steroids, bronchodilators and supportive care.  Still requiring oxygen supplementation.   Planned Discharge Destination: Home  Author: Barton Dubois, MD 05/20/2022 4:20 PM  For on call review [email or web address].

## 2022-05-21 DIAGNOSIS — K219 Gastro-esophageal reflux disease without esophagitis: Secondary | ICD-10-CM | POA: Diagnosis not present

## 2022-05-21 DIAGNOSIS — U071 COVID-19: Secondary | ICD-10-CM | POA: Diagnosis not present

## 2022-05-21 DIAGNOSIS — I10 Essential (primary) hypertension: Secondary | ICD-10-CM | POA: Diagnosis not present

## 2022-05-21 DIAGNOSIS — J9601 Acute respiratory failure with hypoxia: Secondary | ICD-10-CM | POA: Diagnosis not present

## 2022-05-21 LAB — COMPREHENSIVE METABOLIC PANEL
ALT: 20 U/L (ref 0–44)
AST: 21 U/L (ref 15–41)
Albumin: 3.1 g/dL — ABNORMAL LOW (ref 3.5–5.0)
Alkaline Phosphatase: 39 U/L (ref 38–126)
Anion gap: 5 (ref 5–15)
BUN: 17 mg/dL (ref 8–23)
CO2: 30 mmol/L (ref 22–32)
Calcium: 8.5 mg/dL — ABNORMAL LOW (ref 8.9–10.3)
Chloride: 101 mmol/L (ref 98–111)
Creatinine, Ser: 0.47 mg/dL (ref 0.44–1.00)
GFR, Estimated: >60 mL/min (ref >60)
Glucose, Bld: 155 mg/dL — ABNORMAL HIGH (ref 70–99)
Potassium: 4.5 mmol/L (ref 3.5–5.1)
Sodium: 136 mmol/L (ref 135–145)
Total Bilirubin: 0.8 mg/dL (ref 0.3–1.2)
Total Protein: 6 g/dL — ABNORMAL LOW (ref 6.5–8.1)

## 2022-05-21 LAB — CBC WITH DIFFERENTIAL/PLATELET
Abs Immature Granulocytes: 0.04 10*3/uL (ref 0.00–0.07)
Basophils Absolute: 0 10*3/uL (ref 0.0–0.1)
Basophils Relative: 0 %
Eosinophils Absolute: 0 10*3/uL (ref 0.0–0.5)
Eosinophils Relative: 0 %
HCT: 40.3 % (ref 36.0–46.0)
Hemoglobin: 13.9 g/dL (ref 12.0–15.0)
Immature Granulocytes: 1 %
Lymphocytes Relative: 7 %
Lymphs Abs: 0.5 10*3/uL — ABNORMAL LOW (ref 0.7–4.0)
MCH: 30.7 pg (ref 26.0–34.0)
MCHC: 34.5 g/dL (ref 30.0–36.0)
MCV: 89 fL (ref 80.0–100.0)
Monocytes Absolute: 0.2 10*3/uL (ref 0.1–1.0)
Monocytes Relative: 4 %
Neutro Abs: 5.9 10*3/uL (ref 1.7–7.7)
Neutrophils Relative %: 88 %
Platelets: 161 10*3/uL (ref 150–400)
RBC: 4.53 MIL/uL (ref 3.87–5.11)
RDW: 12.4 % (ref 11.5–15.5)
WBC: 6.6 10*3/uL (ref 4.0–10.5)
nRBC: 0 % (ref 0.0–0.2)

## 2022-05-21 LAB — MAGNESIUM: Magnesium: 1.6 mg/dL — ABNORMAL LOW (ref 1.7–2.4)

## 2022-05-21 LAB — C-REACTIVE PROTEIN: CRP: 0.7 mg/dL (ref <1.0)

## 2022-05-21 LAB — PHOSPHORUS: Phosphorus: 1.8 mg/dL — ABNORMAL LOW (ref 2.5–4.6)

## 2022-05-21 LAB — FERRITIN: Ferritin: 214 ng/mL (ref 11–307)

## 2022-05-21 MED ORDER — IPRATROPIUM-ALBUTEROL 20-100 MCG/ACT IN AERS
1.0000 | INHALATION_SPRAY | Freq: Four times a day (QID) | RESPIRATORY_TRACT | Status: DC
  Administered 2022-05-21 – 2022-05-22 (×5): 1 via RESPIRATORY_TRACT

## 2022-05-21 MED ORDER — DEXTROSE 5 % IV SOLN
20.0000 mmol | Freq: Once | INTRAVENOUS | Status: DC
Start: 2022-05-21 — End: 2022-05-21

## 2022-05-21 MED ORDER — CHLORTHALIDONE 25 MG PO TABS
25.0000 mg | ORAL_TABLET | Freq: Every morning | ORAL | Status: DC
  Administered 2022-05-22: 25 mg via ORAL
  Filled 2022-05-21: qty 1

## 2022-05-21 MED ORDER — DEXTROSE 5 % IV SOLN
20.0000 mmol | Freq: Once | INTRAVENOUS | Status: AC
  Administered 2022-05-21: 20 mmol via INTRAVENOUS
  Filled 2022-05-21: qty 6.67

## 2022-05-21 MED ORDER — VITAMIN D 25 MCG (1000 UNIT) PO TABS
1000.0000 [IU] | ORAL_TABLET | Freq: Every day | ORAL | Status: DC
  Administered 2022-05-21 – 2022-05-22 (×2): 1000 [IU] via ORAL
  Filled 2022-05-21 (×2): qty 1

## 2022-05-21 MED ORDER — MAGNESIUM SULFATE 2 GM/50ML IV SOLN
2.0000 g | Freq: Once | INTRAVENOUS | Status: AC
  Administered 2022-05-21: 2 g via INTRAVENOUS
  Filled 2022-05-21: qty 50

## 2022-05-21 NOTE — Progress Notes (Signed)
Progress Note   Patient: Sophia Mccoy OXB:[id number] DOB: [birth year] DOA: 05/19/2022     2 DOS: the patient was seen and examined on 05/21/2022   Brief hospital admission course: ADAHLIA Mccoy is a 66 y.o. female with medical history significant of hypertension, hyperlipidemia and gastroesophageal reflux disease; who presented to the emergency department secondary to shortness of breath, productive coughing spells and general malaise.  Patient reports recently being tested and positive for COVID in the last 5-6 days prior to admission.  Initially fairly asymptomatic; but with further progression of her symptoms to the point of being short winded with minimal activity.  Oxygen saturation at home demonstrated her levels down into the low 80s. Patient reports no nausea, no vomiting, no chest pain, no focal neurologic deficits, no dysuria, no hematuria, no melena, no hematochezia.   Patient expressed decreased oral intake.   In the ED patient was found hypoxic, slightly dehydrated, soft blood pressure, positive COVID by PCR and chest x-ray demonstrating right lung infiltrates.  Steroids, antiviral and bronchodilators were provided.  TRH has been contacted to place patient in the hospital for further evaluation and management.  Assessment and Plan: * Acute respiratory failure with hypoxia ([place]) -Secondary to COVID 19 pneumonia -Continue treatment with Paxlovid and his steroids (currently course and day 3 out of 5 of Paxil.)   -Continue to wean oxygen supplementation as tolerated -Continue antitussive/mucolytic agents -Continue bronchodilators as needed and also the use of flutter valve.   -Patient expressed feeling better overall. -Continue to follow inflammatory markers (trending down appropriately) -Continue vitamin C and zinc  Hypomagnesemia - Magnesium repleted and within normal limits: -Continue to follow electrolytes trend.  Hypokalemia - Potassium 3.1 -Continue further  repletion -Telemetry monitoring for another 24 hours.  GERD (gastroesophageal reflux disease) -GERD/GI prophylaxis while receiving steroids will be achieved with the use of PPI. -Patient reports no nausea or vomiting currently.  Hyperlipidemia -Chronically on a statin -Given initiation of Paxlovid for COVID will hold Lipitor (given medication reactions/interactions). -Heart healthy diet discussed with patient.  Essential hypertension -Blood pressure stable and rising -Will start resuming home antihypertensive agents -Continue to follow vital signs.  Underweight/protein calorie malnutrition. -Patient reports no failure to thrive and expressed that she has always been thin and is not looking to lose weight. -Body mass index is 13.16 kg/m. -She understands a low level of her BMI and is open to pursued  the use of feeding supplements.  Hypophosphatemia -Will replete electrolytes and follow trend.   Subjective:  Continue slowly improving; no chest pain, no nausea, no vomiting.  Short winded with activity, experiencing intermittent coughing spells and required 2 L nasal cannula supplementation.  Physical Exam: Vitals:   05/21/22 0507 05/21/22 0734 05/21/22 1227 05/21/22 1415  BP: (!) 114/91  (!) 158/69   Pulse: (!) 50  (!) 50   Resp: 18  16   Temp: 97.7 F (36.5 C)  97.8 F (36.6 C)   TempSrc: Oral  Oral   SpO2: 94% 94% 94% 95%  Weight:      Height:       General exam: Alert, awake, oriented x 3; reporting intermittent coughing spells and short winded sensation with activity.  Continues well improving.  Patient reports no nausea vomiting. Respiratory system: Mild expiratory wheezing, positive rhonchi; no using accessory muscle.  Requiring 2 L nasal cannula supplementation to maintain saturation. Cardiovascular system:RRR. No murmurs, rubs, gallops. Gastrointestinal system: Abdomen is nondistended, soft and nontender. No organomegaly or masses felt. Normal  bowel sounds  heard. Central nervous system: Alert and oriented. No focal neurological deficits. Extremities: No C/C/E, +pedal pulses Skin: No rashes, lesions or ulcers Psychiatry: Judgement and insight appear normal. Mood & affect appropriate.    Data Reviewed: CBC: WBCs WBC 6.6, hemoglobin 13.9, platelet count 161 K Comprehensive metabolic panel: Sodium 252, potassium 4.5, chloride 101, bicarb 30, glucose 155, BUN 17, creatinine 0.47 and normal LFTs. CRP: 0.7 Ferritin: 214 Magnesium 1.6 and phosphorus 1.8  Family Communication: Sister at bedside.  Disposition: Status is: Inpatient Remains inpatient appropriate because: Continue treatment for COVID-pneumonia using IV steroids, bronchodilators and supportive care.  Still requiring oxygen supplementation.   Planned Discharge Destination: Home  Author: Barton Dubois, MD 05/21/2022 4:40 PM  For on call review [email or web address].

## 2022-05-21 NOTE — Progress Notes (Addendum)
SATURATION QUALIFICATIONS:  Patient Saturations on Room Air at Rest = 86%  Patient Saturations on Room Air while Ambulating = 83%  Patient Saturations on 3 Liters of oxygen while Ambulating 88-91%   Will need o2 due to destat.

## 2022-05-21 NOTE — TOC Initial Note (Signed)
Transition of Care [facility]) - Initial/Assessment Note    Patient Details  Name: Sophia Mccoy MRN: [id number] Date of Birth: December 27, 1955  [facility] CM/SW Contact:    Joanne Chars, LCSW Phone Number: 05/21/2022, 12:05 PM  Clinical Narrative:    CSW spoke with pt by phone regarding need for home O2.  Pt agreeable, no agency choice indicated.  Pt lives at home with husband Jeneen Rinks, permission given to speak with him.  Confirmed home address.  CSW spoke with Jasmine at [facility] who will move forward with delivering the equipment to the hospital.                Expected Discharge Plan: Home/Self Care Barriers to Discharge: No Barriers Identified   Patient Goals and CMS Choice Patient states their goals for this hospitalization and ongoing recovery are:: "100% better"   Choice offered to / list presented to : Patient  Expected Discharge Plan and Services Expected Discharge Plan: Home/Self Care In-house Referral: Clinical Social Work   Post Acute Care Choice: Durable Medical Equipment Living arrangements for the past 2 months: Single Family Home                 DME Arranged: Oxygen DME Agency: [employer] Date DME Agency Contacted: 05/21/22 Time DME Agency Contacted: 1204 Representative spoke with at DME Agency: [place]: NA          Prior Living Arrangements/Services Living arrangements for the past 2 months: Single Family Home Lives with:: Spouse Patient language and need for interpreter reviewed:: Yes Do you feel safe going back to the place where you live?: Yes      Need for Family Participation in Patient Care: No (Comment) Care giver support system in place?: Yes (comment) Current home services: Other (comment) (none) Criminal Activity/Legal Involvement Pertinent to Current Situation/Hospitalization: No - Comment as needed  Activities of Daily Living Home Assistive Devices/Equipment: None ADL Screening (condition at time of  admission) Patient's cognitive ability adequate to safely complete daily activities?: Yes Is the patient deaf or have difficulty hearing?: No Does the patient have difficulty seeing, even when wearing glasses/contacts?: No Does the patient have difficulty concentrating, remembering, or making decisions?: No Patient able to express need for assistance with ADLs?: Yes Does the patient have difficulty dressing or bathing?: No Independently performs ADLs?: Yes (appropriate for developmental age) Does the patient have difficulty walking or climbing stairs?: No Weakness of Legs: None Weakness of Arms/Hands: None  Permission Sought/Granted Permission sought to share information with : Family Supports Permission granted to share information with : Yes, Verbal Permission Granted  Share Information with NAME: husband Jeneen Rinks           Emotional Assessment Appearance::  (phone contact) Attitude/Demeanor/Rapport: Engaged Affect (typically observed): Appropriate, Pleasant Orientation: :  (not recorded, sounded oriented)      Admission diagnosis:  Hypokalemia [E87.6] Acute respiratory failure with hypoxia ([place]) [J96.01] COVID [U07.1] Patient Active Problem List   Diagnosis Date Noted   Acute respiratory failure with hypoxia ([place]) 05/19/2022   Essential hypertension 05/19/2022   Hyperlipidemia 05/19/2022   GERD (gastroesophageal reflux disease) 05/19/2022   Hypokalemia 05/19/2022   Hypomagnesemia 05/19/2022   PCP:  Asencion Noble, MD Pharmacy:   [facility] - [address] [id number] [address] [place] [address] Phone: [phone] Fax: [phone]     Social Determinants of Health (SDOH) Interventions    Readmission Risk Interventions     No data  to display

## 2022-05-22 DIAGNOSIS — K219 Gastro-esophageal reflux disease without esophagitis: Secondary | ICD-10-CM | POA: Diagnosis not present

## 2022-05-22 DIAGNOSIS — U071 COVID-19: Secondary | ICD-10-CM | POA: Diagnosis not present

## 2022-05-22 DIAGNOSIS — I10 Essential (primary) hypertension: Secondary | ICD-10-CM | POA: Diagnosis not present

## 2022-05-22 DIAGNOSIS — J9601 Acute respiratory failure with hypoxia: Secondary | ICD-10-CM | POA: Diagnosis not present

## 2022-05-22 LAB — PHOSPHORUS: Phosphorus: 2.8 mg/dL (ref 2.5–4.6)

## 2022-05-22 LAB — MAGNESIUM: Magnesium: 1.9 mg/dL (ref 1.7–2.4)

## 2022-05-22 LAB — COMPREHENSIVE METABOLIC PANEL
ALT: 21 U/L (ref 0–44)
AST: 21 U/L (ref 15–41)
Albumin: 2.7 g/dL — ABNORMAL LOW (ref 3.5–5.0)
Alkaline Phosphatase: 36 U/L — ABNORMAL LOW (ref 38–126)
Anion gap: 7 (ref 5–15)
BUN: 18 mg/dL (ref 8–23)
CO2: 30 mmol/L (ref 22–32)
Calcium: 8.1 mg/dL — ABNORMAL LOW (ref 8.9–10.3)
Chloride: 97 mmol/L — ABNORMAL LOW (ref 98–111)
Creatinine, Ser: 0.45 mg/dL (ref 0.44–1.00)
GFR, Estimated: >60 mL/min (ref >60)
Glucose, Bld: 144 mg/dL — ABNORMAL HIGH (ref 70–99)
Potassium: 3.2 mmol/L — ABNORMAL LOW (ref 3.5–5.1)
Sodium: 134 mmol/L — ABNORMAL LOW (ref 135–145)
Total Bilirubin: 0.6 mg/dL (ref 0.3–1.2)
Total Protein: 5.5 g/dL — ABNORMAL LOW (ref 6.5–8.1)

## 2022-05-22 LAB — CBC WITH DIFFERENTIAL/PLATELET
Abs Immature Granulocytes: 0.11 10*3/uL — ABNORMAL HIGH (ref 0.00–0.07)
Basophils Absolute: 0 10*3/uL (ref 0.0–0.1)
Basophils Relative: 0 %
Eosinophils Absolute: 0 10*3/uL (ref 0.0–0.5)
Eosinophils Relative: 0 %
HCT: 38.7 % (ref 36.0–46.0)
Hemoglobin: 13.2 g/dL (ref 12.0–15.0)
Immature Granulocytes: 1 %
Lymphocytes Relative: 6 %
Lymphs Abs: 0.5 10*3/uL — ABNORMAL LOW (ref 0.7–4.0)
MCH: 30.3 pg (ref 26.0–34.0)
MCHC: 34.1 g/dL (ref 30.0–36.0)
MCV: 88.8 fL (ref 80.0–100.0)
Monocytes Absolute: 0.3 10*3/uL (ref 0.1–1.0)
Monocytes Relative: 4 %
Neutro Abs: 6.9 10*3/uL (ref 1.7–7.7)
Neutrophils Relative %: 89 %
Platelets: 188 10*3/uL (ref 150–400)
RBC: 4.36 MIL/uL (ref 3.87–5.11)
RDW: 12 % (ref 11.5–15.5)
WBC: 7.8 10*3/uL (ref 4.0–10.5)
nRBC: 0 % (ref 0.0–0.2)

## 2022-05-22 LAB — C-REACTIVE PROTEIN: CRP: 0.5 mg/dL (ref <1.0)

## 2022-05-22 LAB — FERRITIN: Ferritin: 167 ng/mL (ref 11–307)

## 2022-05-22 MED ORDER — ACETAMINOPHEN 500 MG PO TABS
500.0000 mg | ORAL_TABLET | ORAL | Status: AC | PRN
Start: 2022-05-22 — End: ?

## 2022-05-22 MED ORDER — PANTOPRAZOLE SODIUM 40 MG PO TBEC: 40.0000 mg | DELAYED_RELEASE_TABLET | Freq: Every day | ORAL | 2 refills | Status: DC

## 2022-05-22 MED ORDER — POTASSIUM CHLORIDE CRYS ER 20 MEQ PO TBCR: 40.0000 meq | EXTENDED_RELEASE_TABLET | Freq: Every day | ORAL | 1 refills | Status: DC

## 2022-05-22 MED ORDER — ATORVASTATIN CALCIUM 10 MG PO TABS: 10.0000 mg | ORAL_TABLET | Freq: Every day | ORAL | Status: DC

## 2022-05-22 MED ORDER — NIRMATRELVIR/RITONAVIR (PAXLOVID) TABLET (RENAL DOSING): 2.0000 | ORAL_TABLET | Freq: Two times a day (BID) | ORAL | Status: DC

## 2022-05-22 MED ORDER — PREDNISONE 20 MG PO TABS: ORAL_TABLET | ORAL | 0 refills | Status: DC

## 2022-05-22 MED ORDER — GUAIFENESIN-DM 100-10 MG/5ML PO SYRP: 10.0000 mL | ORAL_SOLUTION | Freq: Four times a day (QID) | ORAL | 0 refills | Status: DC | PRN

## 2022-05-22 MED ORDER — ZINC SULFATE 220 (50 ZN) MG PO CAPS: 220.0000 mg | ORAL_CAPSULE | Freq: Every day | ORAL | 1 refills | Status: DC

## 2022-05-22 MED ORDER — IPRATROPIUM-ALBUTEROL 20-100 MCG/ACT IN AERS: 1.0000 | INHALATION_SPRAY | Freq: Four times a day (QID) | RESPIRATORY_TRACT | 1 refills | Status: DC | PRN

## 2022-05-22 MED ORDER — ENSURE ENLIVE PO LIQD: 237.0000 mL | Freq: Two times a day (BID) | ORAL | Status: DC

## 2022-05-22 NOTE — TOC Transition Note (Signed)
Transition of Care [facility]) - CM/SW Discharge Note   Patient Details  Name: Sophia Mccoy MRN: [id number] Date of Birth: 1956/06/13  [facility] CM/SW Contact:  Iona Beard, [place] Phone Number: 05/22/2022, 11:32 AM   Clinical Narrative:    CSW spoke to [place] with [employer] who states that pts O2 was delivered to the room yesterday. Pt has transportable concentrator and backup e-tank provided from [employer]. Per Zack a rep with [employer] will come to pts home within the next 1-2 days to complete the final home set up. TOC signing off.   Final next level of care: Home/Self Care Barriers to Discharge: No Barriers Identified   Patient Goals and CMS Choice Patient states their goals for this hospitalization and ongoing recovery are:: return home CMS Medicare.gov Compare Post Acute Care list provided to:: Patient Choice offered to / list presented to : Patient  Discharge Placement                       Discharge Plan and Services In-house Referral: Clinical Social Work   Post Acute Care Choice: Durable Medical Equipment          DME Arranged: Oxygen DME Agency: [employer] Date DME Agency Contacted: 05/21/22 Time DME Agency Contacted: 1204 Representative spoke with at DME Agency: [place]: NA          Social Determinants of Health ([place]) Interventions     Readmission Risk Interventions     No data to display

## 2022-05-22 NOTE — Discharge Summary (Signed)
Physician Discharge Summary   Patient: Sophia Mccoy MRN: [id number] DOB: [birth year]  Admit date:     05/19/2022  Discharge date: 05/22/22  Discharge Physician: Barton Dubois   PCP: Asencion Noble, MD   Recommendations at discharge:  Reassess blood pressure and adjust antihypertensive regimen as needed Repeat CBC to follow hemoglobin trend, platelet count and WBC stability Repeat basic metabolic panel to follow electrolytes and renal function Repeat chest x-ray in 6 weeks to assure complete resolution of infiltrate Repeat CT scan in 66-monthas part of follow-up given abnormalities that suggested the possibility of malignancy.  Discharge Diagnoses: Principal Problem:   Acute respiratory failure with hypoxia ([place] Active Problems:   Essential hypertension   Hyperlipidemia   GERD (gastroesophageal reflux disease)   Hypokalemia   Hypomagnesemia   COVID   Hypophosphatemia Abnormal CT chest  Brief Hospital admission Course: Sophia SEGUINis a 66y.o. female with medical history significant of hypertension, hyperlipidemia and gastroesophageal reflux disease; who presented to the emergency department secondary to shortness of breath, productive coughing spells and general malaise.  Patient reports recently being tested and positive for COVID in the last 5-6 days prior to admission.  Initially fairly asymptomatic; but with further progression of her symptoms to the point of being short winded with minimal activity.  Oxygen saturation at home demonstrated her levels down into the low 80s. Patient reports no nausea, no vomiting, no chest pain, no focal neurologic deficits, no dysuria, no hematuria, no melena, no hematochezia.   Patient expressed decreased oral intake.   In the ED patient was found hypoxic, slightly dehydrated, soft blood pressure, positive COVID by PCR and chest x-ray demonstrating right lung infiltrates.  Steroids, antiviral and bronchodilators were provided.  [facility] has been  contacted to place patient in the hospital for further evaluation and management.  Assessment and Plan: * Acute respiratory failure with hypoxia ([place] -Secondary to COVID 19 pneumonia -Continue treatment with Paxlovid and tapering steroids (currently course and day 4 out of 5 of Paxil.)   -Continue to wean oxygen supplementation as tolerated -Continue antitussive/mucolytic agents -Continue bronchodilators as needed and also the use of flutter valve.   -Patient expressed feeling significantly better and would like to go home and continue treatment for her condition as an outpatient.  -Continue vitamin C and zinc  Hypomagnesemia - Magnesium repleted and within normal limits: -Continue to follow electrolytes trend.  Hypokalemia - Repleted and within normal limits at discharge -Patient has been started on daily supplementation.  GERD (gastroesophageal reflux disease) -GERD/GI prophylaxis while receiving steroids will be achieved with the use of PPI. -Patient reports no nausea or vomiting currently.  Hyperlipidemia -Chronically on a statin -Given initiation of Paxlovid for COVID will hold Lipitor (given medication reactions/interactions). -Patient will be safe to resume the use of a statin on 05/25/2022. -Heart healthy diet discussed with patient.  Essential hypertension -Blood pressure stable and rising -Advised to maintain adequate hydration and to follow heart healthy diet -Resume home antihypertensive regimen.  Underweight/protein calorie malnutrition. -Patient reports no failure to thrive and expressed that she has always been thin and is not looking to lose weight. -Body mass index is 13.16 kg/m. -She understands a low level of her BMI and is open to pursued  the use of feeding supplements.  Lungs nodularity: -CT chest follow-up in 12 months recommended -Patient with prior history of smoking  Consultants: None Procedures performed: See below for x-ray  reports. Disposition: Home Diet recommendation: Heart healthy diet and feeding  supplement.  DISCHARGE MEDICATION: Allergies as of 05/22/2022       Reactions   Codeine Nausea And Vomiting   Lisinopril Swelling        Medication List     TAKE these medications    acetaminophen 500 MG tablet Commonly known as: TYLENOL Take 1 tablet (500 mg total) by mouth every 4 (four) hours as needed for mild pain or headache (fever >/= 101).   atorvastatin 10 MG tablet Commonly known as: LIPITOR Take 1 tablet (10 mg total) by mouth daily for high cholesterol Start taking on: May 25, 2022 What changed: These instructions start on May 25, 2022. If you are unsure what to do until then, ask your doctor or other care provider.   chlorthalidone 25 MG tablet Commonly known as: HYGROTON Take 1 tablet (25 mg total) by mouth every morning.   cholecalciferol 1000 units tablet Commonly known as: VITAMIN D Take 1,000 Units by mouth daily.   diltiazem 240 MG 24 hr capsule Commonly known as: Cardizem CD Take 1 capsule (240 mg total) by mouth daily.   feeding supplement Liqd Take 237 mLs by mouth 2 (two) times daily between meals.   guaiFENesin-dextromethorphan 100-10 MG/5ML syrup Commonly known as: ROBITUSSIN DM Take 10 mLs by mouth every 6 (six) hours as needed for cough.   Ipratropium-Albuterol 20-100 MCG/ACT Aers respimat Commonly known as: COMBIVENT Inhale 1 puff into the lungs every 6 (six) hours as needed for wheezing or shortness of breath.   nirmatrelvir/ritonavir EUA (renal dosing) 10 x 150 MG & 10 x '100MG'$  Tabs Commonly known as: PAXLOVID Take 2 tablets by mouth 2 (two) times daily. Complete 1 more day with tablets as provided at discharge (our pharmacy will give to you).   pantoprazole 40 MG tablet Commonly known as: PROTONIX Take 1 tablet (40 mg total) by mouth daily. Start taking on: May 23, 2022   potassium chloride SA 20 MEQ tablet Commonly known as: KLOR-CON  M Take 2 tablets (40 mEq total) by mouth daily. Start taking on: May 23, 2022   predniSONE 20 MG tablet Commonly known as: DELTASONE Take 3 tablets by mouth daily x1 day; then 2 tablets by mouth daily x2 days; then 1 tablet by mouth daily x3 days; then half tablet by mouth daily x3 days and stop prednisone.   vitamin C 1000 MG tablet Take 1,000 mg by mouth daily.   zinc sulfate 220 (50 Zn) MG capsule Take 1 capsule (220 mg total) by mouth daily. Start taking on: May 23, 2022               Durable Medical Equipment  (From admission, onward)           Start     Ordered   05/21/22 1057  For home use only DME oxygen  Once       Question Answer Comment  Length of Need 6 Months   Mode or (Route) Nasal cannula   Liters per Minute 2   Frequency Continuous (stationary and portable oxygen unit needed)   Oxygen conserving device Yes   Oxygen delivery system Gas      05/21/22 1056            Follow-up Information     Asencion Noble, MD. Schedule an appointment as soon as possible for a visit in 10 day(s).   Specialty: Internal Medicine Contact information: [address] [place] [address] [phone]  Discharge Exam: Filed Weights   05/19/22 0916 05/19/22 1802  Weight: 38.3 kg 38.1 kg   General exam: Alert, awake, oriented x 3; reporting intermittent coughing spells and short winded sensation with activity.  Continues well improving.  Patient reports no nausea vomiting. Respiratory system: Mild expiratory wheezing, positive rhonchi; no using accessory muscle.  Requiring 2 L nasal cannula supplementation to maintain saturation. Cardiovascular system:RRR. No murmurs, rubs, gallops. Gastrointestinal system: Abdomen is nondistended, soft and nontender. No organomegaly or masses felt. Normal bowel sounds heard. Central nervous system: Alert and oriented. No focal neurological deficits. Extremities: No C/C/E, +pedal  pulses Skin: No rashes, lesions or ulcers Psychiatry: Judgement and insight appear normal. Mood & affect appropriate.     Condition at discharge: Stable and improved.  The results of significant diagnostics from this hospitalization (including imaging, microbiology, ancillary and laboratory) are listed below for reference.   Imaging Studies: CT Chest Wo Contrast  Result Date: 05/19/2022 CLINICAL DATA:  Abnormal radiograph EXAM: CT CHEST WITHOUT CONTRAST TECHNIQUE: Multidetector CT imaging of the chest was performed following the standard protocol without IV contrast. RADIATION DOSE REDUCTION: This exam was performed according to the departmental dose-optimization program which includes automated exposure control, adjustment of the mA and/or kV according to patient size and/or use of iterative reconstruction technique. COMPARISON:  Chest x-ray dated May 19, 2022 FINDINGS: Cardiovascular: Normal heart size. No pericardial effusion. Severe left main and three-vessel coronary artery calcifications. Normal caliber thoracic aorta with severe calcified plaque. Mediastinum/Nodes: Esophagus thyroid are unremarkable no pathologically enlarged lymph nodes seen in the chest. 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 Biapical pleural-parenchymal scarring with associated calcifications. Severe centrilobular emphysema. Clustered centrilobular nodules of the right lower lobe. Additional bilateral scattered pulmonary nodules are seen. Reference solid pulmonary nodule of the lower lobe measuring 4 mm on series 4, image 64. No pleural effusion or pneumothorax. Upper Abdomen: No acute abnormality. Musculoskeletal: No chest wall mass or suspicious bone lesions identified. IMPRESSION: 1. Right lower lobe and right middle lobe bronchi are occluded by debris with scattered centrilobular nodules of the right lower lobe, findings are likely due to aspiration. 2. Area of nodularity questioned on prior chest  radiograph correlates with pleural-parenchymal scarring. 3. Bilateral solid pulmonary nodules, largest measures 4 mm. Non-contrast chest CT can be considered in 12 months if patient is high-risk. This recommendation follows the consensus statement: Guidelines for Management of Incidental Pulmonary Nodules Detected on CT Images: From the Fleischner Society 2017; Radiology 2017; 284:228-243. 4. Severe left main and three-vessel coronary artery calcifications. 5. Aortic Atherosclerosis (ICD10-I70.0) and Emphysema (ICD10-J43.9). Electronically Signed   By: Yetta Glassman M.D.   On: 05/19/2022 15:05   DG Chest Port 1 View  Result Date: 05/19/2022 CLINICAL DATA:  Shortness of breath. EXAM: PORTABLE CHEST 1 VIEW COMPARISON:  None Available. FINDINGS: The heart size and mediastinal contours are within normal limits. Hyperexpansion of the lungs is noted. Probable scarring and emphysematous disease is noted throughout both lungs. Possible right apical nodular density is noted. The visualized skeletal structures are unremarkable. IMPRESSION: Hyperexpansion of the lungs is noted with probable scarring and emphysematous disease noted. Possible right apical nodular density is noted; CT scan is recommended for further evaluation. Aortic Atherosclerosis (ICD10-I70.0) and Emphysema (ICD10-J43.9). Electronically Signed   By: Marijo Conception M.D.   On: 05/19/2022 10:17    Microbiology: Results for orders placed or performed during the hospital encounter of 05/19/22  Resp Panel by RT-PCR (Flu A&B, Covid) Anterior Nasal Swab  Status: Abnormal   Collection Time: 05/19/22 10:35 AM   Specimen: Anterior Nasal Swab  Result Value Ref Range Status   SARS Coronavirus 2 by RT PCR POSITIVE (A) NEGATIVE Final    Comment: (NOTE) SARS-CoV-2 target nucleic acids are DETECTED.  The SARS-CoV-2 RNA is generally detectable in upper respiratory specimens during the acute phase of infection. Positive results are indicative of the  presence of the identified virus, but do not rule out bacterial infection or co-infection with other pathogens not detected by the test. Clinical correlation with patient history and other diagnostic information is necessary to determine patient infection status. The expected result is Negative.  Fact Sheet for Patients: EntrepreneurPulse.com.au  Fact Sheet for Healthcare Providers: IncredibleEmployment.be  This test is not yet approved or cleared by the [place] FDA and  has been authorized for detection and/or diagnosis of SARS-CoV-2 by FDA under an Emergency Use Authorization (EUA).  This EUA will remain in effect (meaning this test can be used) for the duration of  the COVID-19 declaration under Section 564(b)(1) of the A ct, 21 U.S.C. section 360bbb-3(b)(1), unless the authorization is terminated or revoked sooner.     Influenza A by PCR NEGATIVE NEGATIVE Final   Influenza B by PCR NEGATIVE NEGATIVE Final    Comment: (NOTE) The Xpert Xpress SARS-CoV-2/FLU/RSV plus assay is intended as an aid in the diagnosis of influenza from Nasopharyngeal swab specimens and should not be used as a sole basis for treatment. Nasal washings and aspirates are unacceptable for Xpert Xpress SARS-CoV-2/FLU/RSV testing.  Fact Sheet for Patients: EntrepreneurPulse.com.au  Fact Sheet for Healthcare Providers: IncredibleEmployment.be  This test is not yet approved or cleared by the [place] FDA and has been authorized for detection and/or diagnosis of SARS-CoV-2 by FDA under an Emergency Use Authorization (EUA). This EUA will remain in effect (meaning this test can be used) for the duration of the COVID-19 declaration under Section 564(b)(1) of the Act, 21 U.S.C. section 360bbb-3(b)(1), unless the authorization is terminated or revoked.  Performed at [facility], [address], [place], [place] [address]   Blood  Culture (routine x 2)     Status: None (Preliminary result)   Collection Time: 05/19/22  1:35 PM   Specimen: BLOOD  Result Value Ref Range Status   Specimen Description BLOOD BOTTLES DRAWN AEROBIC ONLY  Final   Special Requests   Final    BLOOD RIGHT HAND Blood Culture results may not be optimal due to an inadequate volume of blood received in culture bottles   Culture   Final    NO GROWTH 2 DAYS Performed at [facility], [address] [place], [place] [address]    Report Status PENDING  Incomplete  Blood Culture (routine x 2)     Status: None (Preliminary result)   Collection Time: 05/19/22  1:40 PM   Specimen: BLOOD  Result Value Ref Range Status   Specimen Description BLOOD BOTTLES DRAWN AEROBIC ONLY  Final   Special Requests   Final    BLOOD RIGHT HAND Blood Culture results may not be optimal due to an inadequate volume of blood received in culture bottles   Culture   Final    NO GROWTH 2 DAYS Performed at [facility], [address], [place], [place] [address]    Report Status PENDING  Incomplete    Labs: CBC: Recent Labs  Lab 05/19/22 1012 05/20/22 0552 05/21/22 0525 05/22/22 0504  WBC 6.2 4.1 6.6 7.8  NEUTROABS 5.0 3.4 5.9 6.9  HGB 15.5* 13.1 13.9 13.2  HCT 43.5 37.3 40.3 38.7  MCV 86.8 88.0 89.0 88.8  PLT 122* 122* 161 242   Basic Metabolic Panel: Recent Labs  Lab 05/19/22 0954 05/19/22 1012 05/20/22 0552 05/21/22 0525 05/22/22 0504  NA  --  135 134* 136 134*  K  --  2.5* 3.1* 4.5 3.2*  CL  --  90* 98 101 97*  CO2  --  '29 30 30 30  '$ GLUCOSE  --  112* 146* 155* 144*  BUN  --  '22 18 17 18  '$ CREATININE  --  0.67 0.38* 0.47 0.45  CALCIUM  --  9.0 8.2* 8.5* 8.1*  MG 1.5*  --  1.9 1.6* 1.9  PHOS 3.4  --  2.6 1.8* 2.8   Liver Function Tests: Recent Labs  Lab 05/19/22 0954 05/20/22 0552 05/21/22 0525 05/22/22 0504  AST 33 '23 21 21  '$ ALT '22 16 20 21  '$ ALKPHOS 51 38 39 36*  BILITOT 1.2 0.9 0.8 0.6  PROT 7.2 5.6* 6.0* 5.5*  ALBUMIN 3.9 2.9* 3.1* 2.7*    CBG: No results for input(s): "GLUCAP" in the last 168 hours.  Discharge time spent: greater than 30 minutes.  Signed: Barton Dubois, MD [facility] 05/22/2022

## 2022-05-23 DIAGNOSIS — J9601 Acute respiratory failure with hypoxia: Secondary | ICD-10-CM | POA: Diagnosis not present

## 2022-05-24 LAB — CULTURE, BLOOD (ROUTINE X 2)
Culture: NO GROWTH
Culture: NO GROWTH

## 2022-06-01 ENCOUNTER — Other Ambulatory Visit (HOSPITAL_COMMUNITY): Payer: Self-pay

## 2022-06-01 DIAGNOSIS — U071 COVID-19: Secondary | ICD-10-CM | POA: Diagnosis not present

## 2022-06-01 DIAGNOSIS — I700 Atherosclerosis of aorta: Secondary | ICD-10-CM | POA: Diagnosis not present

## 2022-06-01 DIAGNOSIS — E876 Hypokalemia: Secondary | ICD-10-CM | POA: Diagnosis not present

## 2022-06-01 DIAGNOSIS — E441 Mild protein-calorie malnutrition: Secondary | ICD-10-CM | POA: Diagnosis not present

## 2022-06-01 MED ORDER — COMBIVENT RESPIMAT 20-100 MCG/ACT IN AERS
2.0000 | INHALATION_SPRAY | Freq: Four times a day (QID) | RESPIRATORY_TRACT | 12 refills | Status: DC | PRN
  Filled 2022-06-01: qty 1, 30d supply, fill #0
  Filled 2022-08-25: qty 1, 15d supply, fill #0
  Filled 2022-10-25: qty 1, 30d supply, fill #1
  Filled 2022-12-22 – 2022-12-26 (×2): qty 1, 30d supply, fill #2
  Filled 2023-02-05: qty 1, 30d supply, fill #0
  Filled 2023-03-14: qty 1, 30d supply, fill #1
  Filled 2023-04-05: qty 1, 30d supply, fill #2
  Filled 2023-05-11: qty 1, 30d supply, fill #3

## 2022-06-05 ENCOUNTER — Other Ambulatory Visit (HOSPITAL_COMMUNITY): Payer: Self-pay

## 2022-06-12 ENCOUNTER — Other Ambulatory Visit (HOSPITAL_COMMUNITY): Payer: Self-pay

## 2022-06-12 MED ORDER — POTASSIUM CHLORIDE CRYS ER 20 MEQ PO TBCR
40.0000 meq | EXTENDED_RELEASE_TABLET | Freq: Every day | ORAL | 12 refills | Status: DC
  Filled 2022-06-12: qty 60, 30d supply, fill #0
  Filled 2022-07-18: qty 60, 30d supply, fill #1
  Filled 2022-08-12: qty 60, 30d supply, fill #2
  Filled 2022-09-13: qty 60, 30d supply, fill #3
  Filled 2022-10-11: qty 60, 30d supply, fill #4
  Filled 2022-10-25 – 2022-11-10 (×3): qty 60, 30d supply, fill #5
  Filled 2022-11-10: qty 60, 30d supply, fill #0
  Filled 2022-12-10: qty 60, 30d supply, fill #1
  Filled 2023-01-09: qty 60, 30d supply, fill #2
  Filled 2023-02-05: qty 60, 30d supply, fill #3
  Filled 2023-03-07: qty 60, 30d supply, fill #4
  Filled 2023-04-05: qty 60, 30d supply, fill #5
  Filled 2023-04-27: qty 60, 30d supply, fill #6
  Filled 2023-06-05: qty 60, 30d supply, fill #7
  Filled ????-??-??: fill #6

## 2022-07-18 ENCOUNTER — Other Ambulatory Visit (HOSPITAL_COMMUNITY): Payer: Self-pay

## 2022-08-07 ENCOUNTER — Other Ambulatory Visit (HOSPITAL_COMMUNITY): Payer: Self-pay

## 2022-08-08 ENCOUNTER — Other Ambulatory Visit (HOSPITAL_COMMUNITY): Payer: Self-pay

## 2022-08-08 MED ORDER — ATORVASTATIN CALCIUM 10 MG PO TABS
10.0000 mg | ORAL_TABLET | Freq: Every day | ORAL | 4 refills | Status: DC
  Filled 2022-08-08: qty 90, 90d supply, fill #0
  Filled 2022-10-25 (×2): qty 90, 90d supply, fill #1
  Filled 2023-01-30: qty 90, 90d supply, fill #2
  Filled 2023-01-30: qty 90, 90d supply, fill #0
  Filled 2023-04-27 – 2023-04-30 (×2): qty 90, 90d supply, fill #1

## 2022-08-12 ENCOUNTER — Other Ambulatory Visit (HOSPITAL_COMMUNITY): Payer: Self-pay

## 2022-08-14 ENCOUNTER — Other Ambulatory Visit (HOSPITAL_COMMUNITY): Payer: Self-pay

## 2022-08-25 ENCOUNTER — Other Ambulatory Visit (HOSPITAL_COMMUNITY): Payer: Self-pay

## 2022-08-25 ENCOUNTER — Other Ambulatory Visit: Payer: Self-pay

## 2022-08-28 ENCOUNTER — Other Ambulatory Visit (HOSPITAL_COMMUNITY): Payer: Self-pay

## 2022-08-29 DIAGNOSIS — I10 Essential (primary) hypertension: Secondary | ICD-10-CM | POA: Diagnosis not present

## 2022-08-29 DIAGNOSIS — E785 Hyperlipidemia, unspecified: Secondary | ICD-10-CM | POA: Diagnosis not present

## 2022-08-29 DIAGNOSIS — Z79899 Other long term (current) drug therapy: Secondary | ICD-10-CM | POA: Diagnosis not present

## 2022-09-04 DIAGNOSIS — E876 Hypokalemia: Secondary | ICD-10-CM | POA: Diagnosis not present

## 2022-09-04 DIAGNOSIS — I10 Essential (primary) hypertension: Secondary | ICD-10-CM | POA: Diagnosis not present

## 2022-09-04 DIAGNOSIS — E785 Hyperlipidemia, unspecified: Secondary | ICD-10-CM | POA: Diagnosis not present

## 2022-09-13 ENCOUNTER — Other Ambulatory Visit: Payer: Self-pay

## 2022-09-15 ENCOUNTER — Other Ambulatory Visit (HOSPITAL_COMMUNITY): Payer: Self-pay

## 2022-10-11 ENCOUNTER — Other Ambulatory Visit (HOSPITAL_COMMUNITY): Payer: Self-pay

## 2022-10-25 ENCOUNTER — Other Ambulatory Visit (HOSPITAL_COMMUNITY): Payer: Self-pay

## 2022-10-25 ENCOUNTER — Other Ambulatory Visit: Payer: Self-pay

## 2022-11-06 ENCOUNTER — Other Ambulatory Visit (HOSPITAL_COMMUNITY): Payer: Self-pay

## 2022-11-06 ENCOUNTER — Other Ambulatory Visit: Payer: Self-pay

## 2022-11-10 ENCOUNTER — Other Ambulatory Visit (HOSPITAL_COMMUNITY): Payer: Self-pay

## 2022-11-16 ENCOUNTER — Other Ambulatory Visit: Payer: Self-pay

## 2022-11-27 DIAGNOSIS — I10 Essential (primary) hypertension: Secondary | ICD-10-CM | POA: Diagnosis not present

## 2022-11-27 DIAGNOSIS — Z79899 Other long term (current) drug therapy: Secondary | ICD-10-CM | POA: Diagnosis not present

## 2022-11-27 DIAGNOSIS — E876 Hypokalemia: Secondary | ICD-10-CM | POA: Diagnosis not present

## 2022-12-10 ENCOUNTER — Other Ambulatory Visit: Payer: Self-pay

## 2022-12-22 ENCOUNTER — Other Ambulatory Visit (HOSPITAL_COMMUNITY): Payer: Self-pay

## 2022-12-26 ENCOUNTER — Other Ambulatory Visit (HOSPITAL_COMMUNITY): Payer: Self-pay

## 2023-01-09 ENCOUNTER — Other Ambulatory Visit (HOSPITAL_COMMUNITY): Payer: Self-pay

## 2023-01-12 ENCOUNTER — Other Ambulatory Visit: Payer: Self-pay

## 2023-01-30 ENCOUNTER — Other Ambulatory Visit: Payer: Self-pay

## 2023-01-30 ENCOUNTER — Other Ambulatory Visit (HOSPITAL_COMMUNITY): Payer: Self-pay

## 2023-01-31 ENCOUNTER — Other Ambulatory Visit (HOSPITAL_COMMUNITY): Payer: Self-pay

## 2023-01-31 MED ORDER — CHLORTHALIDONE 25 MG PO TABS
25.0000 mg | ORAL_TABLET | Freq: Every morning | ORAL | 4 refills | Status: DC
  Filled 2023-01-31 – 2023-02-05 (×2): qty 90, 90d supply, fill #0
  Filled 2023-04-27 – 2023-04-30 (×2): qty 90, 90d supply, fill #1

## 2023-02-05 ENCOUNTER — Other Ambulatory Visit (HOSPITAL_COMMUNITY): Payer: Self-pay

## 2023-02-05 ENCOUNTER — Other Ambulatory Visit: Payer: Self-pay

## 2023-03-07 ENCOUNTER — Other Ambulatory Visit (HOSPITAL_COMMUNITY): Payer: Self-pay

## 2023-03-14 ENCOUNTER — Other Ambulatory Visit (HOSPITAL_COMMUNITY): Payer: Self-pay

## 2023-03-28 DIAGNOSIS — I10 Essential (primary) hypertension: Secondary | ICD-10-CM | POA: Diagnosis not present

## 2023-03-28 DIAGNOSIS — E440 Moderate protein-calorie malnutrition: Secondary | ICD-10-CM | POA: Diagnosis not present

## 2023-03-28 DIAGNOSIS — E876 Hypokalemia: Secondary | ICD-10-CM | POA: Diagnosis not present

## 2023-03-28 DIAGNOSIS — Z79899 Other long term (current) drug therapy: Secondary | ICD-10-CM | POA: Diagnosis not present

## 2023-04-05 ENCOUNTER — Other Ambulatory Visit (HOSPITAL_COMMUNITY): Payer: Self-pay

## 2023-04-09 ENCOUNTER — Other Ambulatory Visit (HOSPITAL_COMMUNITY): Payer: Self-pay

## 2023-04-26 ENCOUNTER — Other Ambulatory Visit (HOSPITAL_COMMUNITY): Payer: Self-pay | Admitting: Internal Medicine

## 2023-04-26 DIAGNOSIS — Z1231 Encounter for screening mammogram for malignant neoplasm of breast: Secondary | ICD-10-CM

## 2023-04-27 ENCOUNTER — Other Ambulatory Visit (HOSPITAL_COMMUNITY): Payer: Self-pay

## 2023-04-28 ENCOUNTER — Other Ambulatory Visit (HOSPITAL_COMMUNITY): Payer: Self-pay

## 2023-04-30 ENCOUNTER — Other Ambulatory Visit (HOSPITAL_COMMUNITY): Payer: Self-pay

## 2023-04-30 MED ORDER — DILTIAZEM HCL ER COATED BEADS 240 MG PO CP24
240.0000 mg | ORAL_CAPSULE | Freq: Every day | ORAL | 4 refills | Status: DC
  Filled 2023-05-01: qty 90, 90d supply, fill #0

## 2023-04-30 MED ORDER — DILTIAZEM HCL ER COATED BEADS 240 MG PO CP24
240.0000 mg | ORAL_CAPSULE | Freq: Every day | ORAL | 4 refills | Status: DC
  Filled 2023-04-30: qty 90, 90d supply, fill #0

## 2023-05-01 ENCOUNTER — Other Ambulatory Visit (HOSPITAL_COMMUNITY): Payer: Self-pay

## 2023-05-01 ENCOUNTER — Other Ambulatory Visit: Payer: Self-pay

## 2023-05-01 DIAGNOSIS — E441 Mild protein-calorie malnutrition: Secondary | ICD-10-CM | POA: Diagnosis not present

## 2023-05-01 DIAGNOSIS — Z79899 Other long term (current) drug therapy: Secondary | ICD-10-CM | POA: Diagnosis not present

## 2023-05-01 DIAGNOSIS — I10 Essential (primary) hypertension: Secondary | ICD-10-CM | POA: Diagnosis not present

## 2023-05-01 DIAGNOSIS — E876 Hypokalemia: Secondary | ICD-10-CM | POA: Diagnosis not present

## 2023-05-02 ENCOUNTER — Ambulatory Visit (HOSPITAL_COMMUNITY)
Admission: RE | Admit: 2023-05-02 | Discharge: 2023-05-02 | Disposition: A | Discharge status: Home / Self Care | Payer: Commercial Managed Care - PPO | Source: Ambulatory Visit | Attending: Internal Medicine | Admitting: Internal Medicine

## 2023-05-02 ENCOUNTER — Encounter (HOSPITAL_COMMUNITY): Payer: Self-pay

## 2023-05-02 DIAGNOSIS — Z1231 Encounter for screening mammogram for malignant neoplasm of breast: Secondary | ICD-10-CM | POA: Insufficient documentation

## 2023-05-09 DIAGNOSIS — Z0000 Encounter for general adult medical examination without abnormal findings: Secondary | ICD-10-CM | POA: Diagnosis not present

## 2023-05-11 ENCOUNTER — Other Ambulatory Visit (HOSPITAL_COMMUNITY): Payer: Self-pay

## 2023-06-05 ENCOUNTER — Other Ambulatory Visit (HOSPITAL_COMMUNITY): Payer: Self-pay

## 2023-06-11 ENCOUNTER — Other Ambulatory Visit (HOSPITAL_COMMUNITY): Payer: Self-pay | Admitting: Internal Medicine

## 2023-06-11 ENCOUNTER — Other Ambulatory Visit (HOSPITAL_COMMUNITY): Payer: Self-pay

## 2023-06-11 DIAGNOSIS — R911 Solitary pulmonary nodule: Secondary | ICD-10-CM

## 2023-06-11 MED ORDER — COMBIVENT RESPIMAT 20-100 MCG/ACT IN AERS
2.0000 | INHALATION_SPRAY | Freq: Four times a day (QID) | RESPIRATORY_TRACT | 12 refills | Status: DC | PRN
Start: 2023-06-11 — End: 2023-07-04
  Filled 2023-06-11: qty 4, 30d supply, fill #0

## 2023-06-21 ENCOUNTER — Ambulatory Visit (HOSPITAL_COMMUNITY)
Admission: RE | Admit: 2023-06-21 | Discharge: 2023-06-21 | Disposition: A | Discharge status: Home / Self Care | Payer: Commercial Managed Care - PPO | Source: Ambulatory Visit | Attending: Internal Medicine | Admitting: Internal Medicine

## 2023-06-21 DIAGNOSIS — I700 Atherosclerosis of aorta: Secondary | ICD-10-CM | POA: Diagnosis not present

## 2023-06-21 DIAGNOSIS — J432 Centrilobular emphysema: Secondary | ICD-10-CM | POA: Diagnosis not present

## 2023-06-21 DIAGNOSIS — R911 Solitary pulmonary nodule: Secondary | ICD-10-CM | POA: Insufficient documentation

## 2023-07-02 ENCOUNTER — Encounter (HOSPITAL_COMMUNITY): Payer: Self-pay

## 2023-07-02 ENCOUNTER — Inpatient Hospital Stay (HOSPITAL_COMMUNITY)
Admission: EM | Admit: 2023-07-02 | Discharge: 2023-07-04 | DRG: 308 | Disposition: A | Discharge status: Home / Self Care | Payer: Commercial Managed Care - PPO | Attending: Family Medicine | Admitting: Family Medicine

## 2023-07-02 ENCOUNTER — Other Ambulatory Visit: Payer: Self-pay

## 2023-07-02 ENCOUNTER — Emergency Department (HOSPITAL_COMMUNITY): Payer: Commercial Managed Care - PPO

## 2023-07-02 DIAGNOSIS — E876 Hypokalemia: Secondary | ICD-10-CM | POA: Diagnosis present

## 2023-07-02 DIAGNOSIS — I4892 Unspecified atrial flutter: Principal | ICD-10-CM | POA: Diagnosis present

## 2023-07-02 DIAGNOSIS — Z79899 Other long term (current) drug therapy: Secondary | ICD-10-CM | POA: Diagnosis not present

## 2023-07-02 DIAGNOSIS — R64 Cachexia: Secondary | ICD-10-CM | POA: Diagnosis present

## 2023-07-02 DIAGNOSIS — I700 Atherosclerosis of aorta: Secondary | ICD-10-CM | POA: Diagnosis not present

## 2023-07-02 DIAGNOSIS — R636 Underweight: Secondary | ICD-10-CM | POA: Diagnosis present

## 2023-07-02 DIAGNOSIS — R0600 Dyspnea, unspecified: Secondary | ICD-10-CM | POA: Diagnosis not present

## 2023-07-02 DIAGNOSIS — J449 Chronic obstructive pulmonary disease, unspecified: Secondary | ICD-10-CM | POA: Diagnosis present

## 2023-07-02 DIAGNOSIS — R531 Weakness: Secondary | ICD-10-CM | POA: Diagnosis not present

## 2023-07-02 DIAGNOSIS — K219 Gastro-esophageal reflux disease without esophagitis: Secondary | ICD-10-CM | POA: Diagnosis not present

## 2023-07-02 DIAGNOSIS — E785 Hyperlipidemia, unspecified: Secondary | ICD-10-CM | POA: Diagnosis not present

## 2023-07-02 DIAGNOSIS — I10 Essential (primary) hypertension: Secondary | ICD-10-CM | POA: Diagnosis not present

## 2023-07-02 DIAGNOSIS — R0602 Shortness of breath: Secondary | ICD-10-CM | POA: Diagnosis not present

## 2023-07-02 DIAGNOSIS — E7800 Pure hypercholesterolemia, unspecified: Secondary | ICD-10-CM | POA: Diagnosis present

## 2023-07-02 DIAGNOSIS — R627 Adult failure to thrive: Secondary | ICD-10-CM | POA: Diagnosis present

## 2023-07-02 DIAGNOSIS — Z90710 Acquired absence of both cervix and uterus: Secondary | ICD-10-CM

## 2023-07-02 DIAGNOSIS — J439 Emphysema, unspecified: Secondary | ICD-10-CM | POA: Diagnosis present

## 2023-07-02 DIAGNOSIS — R54 Age-related physical debility: Secondary | ICD-10-CM | POA: Diagnosis present

## 2023-07-02 DIAGNOSIS — Z8616 Personal history of COVID-19: Secondary | ICD-10-CM

## 2023-07-02 DIAGNOSIS — Z1152 Encounter for screening for COVID-19: Secondary | ICD-10-CM

## 2023-07-02 DIAGNOSIS — Z87891 Personal history of nicotine dependence: Secondary | ICD-10-CM

## 2023-07-02 DIAGNOSIS — J9601 Acute respiratory failure with hypoxia: Secondary | ICD-10-CM | POA: Diagnosis not present

## 2023-07-02 DIAGNOSIS — Z888 Allergy status to other drugs, medicaments and biological substances status: Secondary | ICD-10-CM | POA: Diagnosis not present

## 2023-07-02 DIAGNOSIS — I2510 Atherosclerotic heart disease of native coronary artery without angina pectoris: Secondary | ICD-10-CM | POA: Diagnosis not present

## 2023-07-02 DIAGNOSIS — Z681 Body mass index (BMI) 19 or less, adult: Secondary | ICD-10-CM

## 2023-07-02 DIAGNOSIS — R918 Other nonspecific abnormal finding of lung field: Secondary | ICD-10-CM | POA: Diagnosis not present

## 2023-07-02 LAB — CBC WITH DIFFERENTIAL/PLATELET
Abs Immature Granulocytes: 0.02 10*3/uL (ref 0.00–0.07)
Basophils Absolute: 0.1 10*3/uL (ref 0.0–0.1)
Basophils Relative: 1 %
Eosinophils Absolute: 0.1 10*3/uL (ref 0.0–0.5)
Eosinophils Relative: 1 %
HCT: 50.5 % — ABNORMAL HIGH (ref 36.0–46.0)
Hemoglobin: 17.3 g/dL — ABNORMAL HIGH (ref 12.0–15.0)
Immature Granulocytes: 0 %
Lymphocytes Relative: 13 %
Lymphs Abs: 1.2 10*3/uL (ref 0.7–4.0)
MCH: 30 pg (ref 26.0–34.0)
MCHC: 34.3 g/dL (ref 30.0–36.0)
MCV: 87.7 fL (ref 80.0–100.0)
Monocytes Absolute: 0.7 10*3/uL (ref 0.1–1.0)
Monocytes Relative: 8 %
Neutro Abs: 7.4 10*3/uL (ref 1.7–7.7)
Neutrophils Relative %: 77 %
Platelets: 257 10*3/uL (ref 150–400)
RBC: 5.76 MIL/uL — ABNORMAL HIGH (ref 3.87–5.11)
RDW: 12.7 % (ref 11.5–15.5)
WBC: 9.5 10*3/uL (ref 4.0–10.5)
nRBC: 0 % (ref 0.0–0.2)

## 2023-07-02 LAB — COMPREHENSIVE METABOLIC PANEL
ALT: 21 U/L (ref 0–44)
AST: 19 U/L (ref 15–41)
Albumin: 3.9 g/dL (ref 3.5–5.0)
Alkaline Phosphatase: 54 U/L (ref 38–126)
Anion gap: 10 (ref 5–15)
BUN: 20 mg/dL (ref 8–23)
CO2: 26 mmol/L (ref 22–32)
Calcium: 9.1 mg/dL (ref 8.9–10.3)
Chloride: 96 mmol/L — ABNORMAL LOW (ref 98–111)
Creatinine, Ser: 0.78 mg/dL (ref 0.44–1.00)
GFR, Estimated: >60 mL/min (ref >60)
Glucose, Bld: 151 mg/dL — ABNORMAL HIGH (ref 70–99)
Potassium: 3.9 mmol/L (ref 3.5–5.1)
Sodium: 132 mmol/L — ABNORMAL LOW (ref 135–145)
Total Bilirubin: 0.8 mg/dL (ref 0.3–1.2)
Total Protein: 6.8 g/dL (ref 6.5–8.1)

## 2023-07-02 LAB — I-STAT CHEM 8, ED
BUN: 20 mg/dL (ref 8–23)
Calcium, Ion: 1.09 mmol/L — ABNORMAL LOW (ref 1.15–1.40)
Chloride: 100 mmol/L (ref 98–111)
Creatinine, Ser: 0.8 mg/dL (ref 0.44–1.00)
Glucose, Bld: 139 mg/dL — ABNORMAL HIGH (ref 70–99)
HCT: 48 % — ABNORMAL HIGH (ref 36.0–46.0)
Hemoglobin: 16.3 g/dL — ABNORMAL HIGH (ref 12.0–15.0)
Potassium: 3.9 mmol/L (ref 3.5–5.1)
Sodium: 134 mmol/L — ABNORMAL LOW (ref 135–145)
TCO2: 22 mmol/L (ref 22–32)

## 2023-07-02 LAB — POCT I-STAT, CHEM 8
BUN: 19 mg/dL (ref 8–23)
Calcium, Ion: 1.16 mmol/L (ref 1.15–1.40)
Chloride: 100 mmol/L (ref 98–111)
Creatinine, Ser: 0.6 mg/dL (ref 0.44–1.00)
Glucose, Bld: 127 mg/dL — ABNORMAL HIGH (ref 70–99)
HCT: 47 % — ABNORMAL HIGH (ref 36.0–46.0)
Hemoglobin: 16 g/dL — ABNORMAL HIGH (ref 12.0–15.0)
Potassium: 3.9 mmol/L (ref 3.5–5.1)
Sodium: 134 mmol/L — ABNORMAL LOW (ref 135–145)
TCO2: 22 mmol/L (ref 22–32)

## 2023-07-02 LAB — MRSA NEXT GEN BY PCR, NASAL: MRSA by PCR Next Gen: NOT DETECTED

## 2023-07-02 LAB — TROPONIN I (HIGH SENSITIVITY)
Troponin I (High Sensitivity): 8 ng/L (ref <18)
Troponin I (High Sensitivity): 8 ng/L (ref <18)

## 2023-07-02 LAB — HEPARIN LEVEL (UNFRACTIONATED)
Heparin Unfractionated: 0.18 [IU]/mL — ABNORMAL LOW (ref 0.30–0.70)
Heparin Unfractionated: 0.43 [IU]/mL (ref 0.30–0.70)

## 2023-07-02 LAB — BLOOD GAS, VENOUS
Acid-Base Excess: 3.4 mmol/L — ABNORMAL HIGH (ref 0.0–2.0)
Bicarbonate: 26.8 mmol/L (ref 20.0–28.0)
Drawn by: 51174
O2 Saturation: 73.5 %
Patient temperature: 36.5
pCO2, Ven: 35 mm[Hg] — ABNORMAL LOW (ref 44–60)
pH, Ven: 7.49 — ABNORMAL HIGH (ref 7.25–7.43)
pO2, Ven: 40 mm[Hg] (ref 32–45)

## 2023-07-02 LAB — RESP PANEL BY RT-PCR (RSV, FLU A&B, COVID)  RVPGX2
Influenza A by PCR: NEGATIVE
Influenza B by PCR: NEGATIVE
Resp Syncytial Virus by PCR: NEGATIVE
SARS Coronavirus 2 by RT PCR: NEGATIVE

## 2023-07-02 LAB — MAGNESIUM: Magnesium: 1.5 mg/dL — ABNORMAL LOW (ref 1.7–2.4)

## 2023-07-02 LAB — BRAIN NATRIURETIC PEPTIDE: B Natriuretic Peptide: 196 pg/mL — ABNORMAL HIGH (ref 0.0–100.0)

## 2023-07-02 LAB — TSH: TSH: 2.045 u[IU]/mL (ref 0.350–4.500)

## 2023-07-02 MED ORDER — OXYCODONE HCL 5 MG PO TABS: 5.0000 mg | ORAL_TABLET | ORAL | Status: DC | PRN

## 2023-07-02 MED ORDER — DILTIAZEM LOAD VIA INFUSION
10.0000 mg | Freq: Once | INTRAVENOUS | Status: AC
  Administered 2023-07-02: 10 mg via INTRAVENOUS
  Filled 2023-07-02: qty 10

## 2023-07-02 MED ORDER — MAGNESIUM SULFATE 2 GM/50ML IV SOLN
2.0000 g | Freq: Once | INTRAVENOUS | Status: AC
  Administered 2023-07-02: 2 g via INTRAVENOUS
  Filled 2023-07-02: qty 50

## 2023-07-02 MED ORDER — HEPARIN BOLUS VIA INFUSION
2000.0000 [IU] | Freq: Once | INTRAVENOUS | Status: AC
  Administered 2023-07-02: 2000 [IU] via INTRAVENOUS

## 2023-07-02 MED ORDER — ONDANSETRON HCL 4 MG/2ML IJ SOLN: 4.0000 mg | Freq: Four times a day (QID) | INTRAMUSCULAR | Status: DC | PRN

## 2023-07-02 MED ORDER — PANTOPRAZOLE SODIUM 40 MG PO TBEC
40.0000 mg | DELAYED_RELEASE_TABLET | Freq: Every day | ORAL | Status: DC
  Administered 2023-07-02 – 2023-07-03 (×2): 40 mg via ORAL
  Filled 2023-07-02 (×2): qty 1

## 2023-07-02 MED ORDER — ATORVASTATIN CALCIUM 10 MG PO TABS
10.0000 mg | ORAL_TABLET | Freq: Every evening | ORAL | Status: DC
  Administered 2023-07-02 – 2023-07-03 (×2): 10 mg via ORAL
  Filled 2023-07-02 (×2): qty 1

## 2023-07-02 MED ORDER — ACETAMINOPHEN 325 MG PO TABS: 650.0000 mg | ORAL_TABLET | Freq: Four times a day (QID) | ORAL | Status: DC | PRN

## 2023-07-02 MED ORDER — ACETAMINOPHEN 650 MG RE SUPP: 650.0000 mg | Freq: Four times a day (QID) | RECTAL | Status: DC | PRN

## 2023-07-02 MED ORDER — ONDANSETRON HCL 4 MG PO TABS: 4.0000 mg | ORAL_TABLET | Freq: Four times a day (QID) | ORAL | Status: DC | PRN

## 2023-07-02 MED ORDER — FENTANYL CITRATE PF 50 MCG/ML IJ SOSY: 12.5000 ug | PREFILLED_SYRINGE | INTRAMUSCULAR | Status: DC | PRN

## 2023-07-02 MED ORDER — DILTIAZEM HCL-DEXTROSE 125-5 MG/125ML-% IV SOLN (PREMIX)
5.0000 mg/h | INTRAVENOUS | Status: DC
  Administered 2023-07-02: 5 mg/h via INTRAVENOUS
  Filled 2023-07-02: qty 125

## 2023-07-02 MED ORDER — HEPARIN (PORCINE) 25000 UT/250ML-% IV SOLN
700.0000 [IU]/h | INTRAVENOUS | Status: DC
  Administered 2023-07-02: 550 [IU]/h via INTRAVENOUS
  Filled 2023-07-02: qty 250

## 2023-07-02 MED ORDER — BISACODYL 5 MG PO TBEC: 5.0000 mg | DELAYED_RELEASE_TABLET | Freq: Every day | ORAL | Status: DC | PRN

## 2023-07-02 MED ORDER — CHLORHEXIDINE GLUCONATE CLOTH 2 % EX PADS
6.0000 | MEDICATED_PAD | Freq: Every day | CUTANEOUS | Status: DC
  Administered 2023-07-02 – 2023-07-03 (×2): 6 via TOPICAL

## 2023-07-02 MED ORDER — IOHEXOL 350 MG/ML SOLN
75.0000 mL | Freq: Once | INTRAVENOUS | Status: AC | PRN
  Administered 2023-07-02: 75 mL via INTRAVENOUS

## 2023-07-02 MED ORDER — TRAZODONE HCL 50 MG PO TABS: 25.0000 mg | ORAL_TABLET | Freq: Every evening | ORAL | Status: DC | PRN

## 2023-07-02 MED ORDER — IPRATROPIUM-ALBUTEROL 0.5-2.5 (3) MG/3ML IN SOLN
3.0000 mL | Freq: Four times a day (QID) | RESPIRATORY_TRACT | Status: DC | PRN
  Administered 2023-07-02 – 2023-07-04 (×4): 3 mL via RESPIRATORY_TRACT
  Filled 2023-07-02 (×4): qty 3

## 2023-07-02 MED ORDER — HEPARIN BOLUS VIA INFUSION
1000.0000 [IU] | Freq: Once | INTRAVENOUS | Status: AC
  Administered 2023-07-02: 1000 [IU] via INTRAVENOUS
  Filled 2023-07-02: qty 1000

## 2023-07-02 NOTE — ED Notes (Signed)
Pt in CT.

## 2023-07-02 NOTE — Plan of Care (Signed)
Problem: Clinical Measurements: Goal: Respiratory complications will improve Outcome: Progressing Goal: Cardiovascular complication will be avoided Outcome: Progressing   Problem: Activity: Goal: Risk for activity intolerance will decrease Outcome: Progressing   Problem: Coping: Goal: Level of anxiety will decrease Outcome: Progressing

## 2023-07-02 NOTE — Progress Notes (Signed)
PHARMACY - ANTICOAGULATION CONSULT NOTE  Pharmacy Consult for heparin Indication: atrial fibrillation  Allergies  Allergen Reactions   Codeine Nausea And Vomiting   Lisinopril Swelling    Patient Measurements: Height: 5\' 7"  (170.2 cm) Weight: 39 kg (86 lb) IBW/kg (Calculated) : 61.6 Heparin Dosing Weight: 39 kg  Vital Signs: Temp: 97.7 F (36.5 C) (10/14 0636) Temp Source: Oral (10/14 0636) BP: 122/86 (10/14 0700) Pulse Rate: 139 (10/14 0700)  Labs: Recent Labs    07/02/23 0640  HGB 17.3*  HCT 50.5*  PLT 257  CREATININE 0.78  TROPONINIHS 8    Estimated Creatinine Clearance: 42.6 mL/min (by C-G formula based on SCr of 0.78 mg/dL).   Medical History: Past Medical History:  Diagnosis Date   Hypertension     Medications:  (Not in a hospital admission)   Assessment: Pharmacy consulted to dose heparin in patient with atrial fibrillation.  Patient is not on anticoagulation prior to admission.  CBC WNL  Goal of Therapy:  Heparin level 0.3-0.7 units/ml Monitor platelets by anticoagulation protocol: Yes   Plan:  Give 2000 units bolus x 1 Start heparin infusion at 550 units/hr Check anti-Xa level in 6-8 hours and daily while on heparin Continue to monitor H&H and platelets  Judeth Cornfield, PharmD Clinical Pharmacist 07/02/2023 7:45 AM

## 2023-07-02 NOTE — ED Notes (Addendum)
ED TO INPATIENT HANDOFF REPORT  ED Nurse Name and Phone #: Al Corpus, RN [phone]  S Name/Age/Gender Sophia Mccoy 67 y.o. female Room/Bed: APA19/APA19  Code Status   Code Status: Full Code  Home/SNF/Other Home Patient oriented to: self, place, time, and situation Is this baseline? Yes   Triage Complete: Triage complete  Chief Complaint Atrial flutter with rapid ventricular response (HCC) [I48.92]  Triage Note Pt states she had covid last year and has been progressively more short of breath since; this morning she noticed a significant change when she walked a short distance and could not catch her breath, felt "shaky" and felt like her heart was racing so she came to the ED. Marland Kitchen   Allergies Allergies  Allergen Reactions   Codeine Nausea And Vomiting   Lisinopril Swelling    Level of Care/Admitting Diagnosis ED Disposition     ED Disposition  Admit   Condition  --   Comment  Hospital Area: [facility] [[id number]]  Level of Care: Stepdown [14]  Covid Evaluation: Asymptomatic - no recent exposure (last 10 days) testing not required  Diagnosis: Atrial flutter with rapid ventricular response [facility]) [[id number]]  Admitting Physician: Cleora Fleet [[id number]]  Attending Physician: Cleora Fleet [[id number]]  Certification:: I certify this patient will need inpatient services for at least 2 midnights  Expected Medical Readiness: 07/05/2023          B Medical/Surgery History Past Medical History:  Diagnosis Date   Hypertension    Past Surgical History:  Procedure Laterality Date   ABDOMINAL HYSTERECTOMY     TEMPOROMANDIBULAR JOINT SURGERY     TONSILLECTOMY       A IV Location/Drains/Wounds Patient Lines/Drains/Airways Status     Active Line/Drains/Airways     Name Placement date Placement time Site Days   Peripheral IV 07/02/23 20 G Right Antecubital 07/02/23  0639  Antecubital  less than 1   Peripheral IV 07/02/23 20 G 1" Anterior;Right Forearm  07/02/23  0757  Forearm  less than 1            Intake/Output Last 24 hours  Intake/Output Summary (Last 24 hours) at 07/02/2023 1206 Last data filed at 07/02/2023 1005 Gross per 24 hour  Intake 50 ml  Output --  Net 50 ml    Labs/Imaging Results for orders placed or performed during the hospital encounter of 07/02/23 (from the past 48 hour(s))  Comprehensive metabolic panel     Status: Abnormal   Collection Time: 07/02/23  6:40 AM  Result Value Ref Range   Sodium 132 (L) 135 - 145 mmol/L   Potassium 3.9 3.5 - 5.1 mmol/L   Chloride 96 (L) 98 - 111 mmol/L   CO2 26 22 - 32 mmol/L   Glucose, Bld 151 (H) 70 - 99 mg/dL    Comment: Glucose reference range applies only to samples taken after fasting for at least 8 hours.   BUN 20 8 - 23 mg/dL   Creatinine, Ser 0.86 0.44 - 1.00 mg/dL   Calcium 9.1 8.9 - 57.8 mg/dL   Total Protein 6.8 6.5 - 8.1 g/dL   Albumin 3.9 3.5 - 5.0 g/dL   AST 19 15 - 41 U/L   ALT 21 0 - 44 U/L   Alkaline Phosphatase 54 38 - 126 U/L   Total Bilirubin 0.8 0.3 - 1.2 mg/dL   GFR, Estimated >46 >96 mL/min    Comment: (NOTE) Calculated using the CKD-EPI Creatinine Equation (2021)    Anion  gap 10 5 - 15    Comment: Performed at [facility], [address] [place], [place] [address]  Brain natriuretic peptide     Status: Abnormal   Collection Time: 07/02/23  6:40 AM  Result Value Ref Range   B Natriuretic Peptide 196.0 (H) 0.0 - 100.0 pg/mL    Comment: Performed at [facility], [address] [place], [place] [address]  CBC with Differential/Platelet     Status: Abnormal   Collection Time: 07/02/23  6:40 AM  Result Value Ref Range   WBC 9.5 4.0 - 10.5 K/uL   RBC 5.76 (H) 3.87 - 5.11 MIL/uL   Hemoglobin 17.3 (H) 12.0 - 15.0 g/dL   HCT 09.8 (H) 11.9 - 14.7 %   MCV 87.7 80.0 - 100.0 fL   MCH 30.0 26.0 - 34.0 pg   MCHC 34.3 30.0 - 36.0 g/dL   RDW 82.9 56.2 - 13.0 %   Platelets 257 150 - 400 K/uL   nRBC 0.0 0.0 - 0.2 %   Neutrophils Relative % 77 %    Neutro Abs 7.4 1.7 - 7.7 K/uL   Lymphocytes Relative 13 %   Lymphs Abs 1.2 0.7 - 4.0 K/uL   Monocytes Relative 8 %   Monocytes Absolute 0.7 0.1 - 1.0 K/uL   Eosinophils Relative 1 %   Eosinophils Absolute 0.1 0.0 - 0.5 K/uL   Basophils Relative 1 %   Basophils Absolute 0.1 0.0 - 0.1 K/uL   Immature Granulocytes 0 %   Abs Immature Granulocytes 0.02 0.00 - 0.07 K/uL    Comment: Performed at [facility], [address], [place], [place] [address]  Troponin I (High Sensitivity)     Status: None   Collection Time: 07/02/23  6:40 AM  Result Value Ref Range   Troponin I (High Sensitivity) 8 <18 ng/L    Comment: (NOTE) Elevated high sensitivity troponin I (hsTnI) values and significant  changes across serial measurements may suggest ACS but many other  chronic and acute conditions are known to elevate hsTnI results.  Refer to the "Links" section for chest pain algorithms and additional  guidance. Performed at [facility], [address], [place], [place] [address]   Magnesium     Status: Abnormal   Collection Time: 07/02/23  6:40 AM  Result Value Ref Range   Magnesium 1.5 (L) 1.7 - 2.4 mg/dL    Comment: Performed at [facility], [address], [place], [place] [address]  Resp panel by RT-PCR (RSV, Flu A&B, Covid) Anterior Nasal Swab     Status: None   Collection Time: 07/02/23  6:53 AM   Specimen: Anterior Nasal Swab  Result Value Ref Range   SARS Coronavirus 2 by RT PCR NEGATIVE NEGATIVE    Comment: (NOTE) SARS-CoV-2 target nucleic acids are NOT DETECTED.  The SARS-CoV-2 RNA is generally detectable in upper respiratory specimens during the acute phase of infection. The lowest concentration of SARS-CoV-2 viral copies this assay can detect is 138 copies/mL. A negative result does not preclude SARS-Cov-2 infection and should not be used as the sole basis for treatment or other patient management decisions. A negative result may occur with  improper specimen collection/handling,  submission of specimen other than nasopharyngeal swab, presence of viral mutation(s) within the areas targeted by this assay, and inadequate number of viral copies(<138 copies/mL). A negative result must be combined with clinical observations, patient history, and epidemiological information. The expected result is Negative.  Fact Sheet for Patients:  BloggerCourse.com  Fact Sheet for Healthcare Providers:  SeriousBroker.it  This test is no t yet approved or cleared by the [place] FDA and  has been authorized for detection and/or diagnosis of SARS-CoV-2 by FDA under an Emergency Use Authorization (EUA). This EUA will remain  in effect (meaning this test can be used) for the duration of the COVID-19 declaration under Section 564(b)(1) of the Act, 21 U.S.C.section 360bbb-3(b)(1), unless the authorization is terminated  or revoked sooner.       Influenza A by PCR NEGATIVE NEGATIVE   Influenza B by PCR NEGATIVE NEGATIVE    Comment: (NOTE) The Xpert Xpress SARS-CoV-2/FLU/RSV plus assay is intended as an aid in the diagnosis of influenza from Nasopharyngeal swab specimens and should not be used as a sole basis for treatment. Nasal washings and aspirates are unacceptable for Xpert Xpress SARS-CoV-2/FLU/RSV testing.  Fact Sheet for Patients: BloggerCourse.com  Fact Sheet for Healthcare Providers: SeriousBroker.it  This test is not yet approved or cleared by the [place] FDA and has been authorized for detection and/or diagnosis of SARS-CoV-2 by FDA under an Emergency Use Authorization (EUA). This EUA will remain in effect (meaning this test can be used) for the duration of the COVID-19 declaration under Section 564(b)(1) of the Act, 21 U.S.C. section 360bbb-3(b)(1), unless the authorization is terminated or revoked.     Resp Syncytial Virus by PCR NEGATIVE NEGATIVE     Comment: (NOTE) Fact Sheet for Patients: BloggerCourse.com  Fact Sheet for Healthcare Providers: SeriousBroker.it  This test is not yet approved or cleared by the [place] FDA and has been authorized for detection and/or diagnosis of SARS-CoV-2 by FDA under an Emergency Use Authorization (EUA). This EUA will remain in effect (meaning this test can be used) for the duration of the COVID-19 declaration under Section 564(b)(1) of the Act, 21 U.S.C. section 360bbb-3(b)(1), unless the authorization is terminated or revoked.  Performed at [facility], [address] [place], [place] [address]   I-Stat Chem 8, ED     Status: Abnormal   Collection Time: 07/02/23  6:58 AM  Result Value Ref Range   Sodium 134 (L) 135 - 145 mmol/L   Potassium 3.9 3.5 - 5.1 mmol/L   Chloride 100 98 - 111 mmol/L   BUN 20 8 - 23 mg/dL   Creatinine, Ser 8.29 0.44 - 1.00 mg/dL   Glucose, Bld 562 (H) 70 - 99 mg/dL    Comment: Glucose reference range applies only to samples taken after fasting for at least 8 hours.   Calcium, Ion 1.09 (L) 1.15 - 1.40 mmol/L   TCO2 22 22 - 32 mmol/L   Hemoglobin 16.3 (H) 12.0 - 15.0 g/dL   HCT 13.0 (H) 86.5 - 78.4 %  Blood gas, venous     Status: Abnormal   Collection Time: 07/02/23  7:17 AM  Result Value Ref Range   pH, Ven 7.49 (H) 7.25 - 7.43   pCO2, Ven 35 (L) 44 - 60 mmHg   pO2, Ven 40 32 - 45 mmHg   Bicarbonate 26.8 20.0 - 28.0 mmol/L   Acid-Base Excess 3.4 (H) 0.0 - 2.0 mmol/L   O2 Saturation 73.5 %   Patient temperature 36.5    Collection site LEFT ANTECUBITAL    Drawn by [id number]     Comment: Performed at [facility], [address] [place], [place] [address]  Troponin I (High Sensitivity)     Status: None   Collection Time: 07/02/23  8:43 AM  Result Value Ref  Range   Troponin I (High Sensitivity) 8 <18 ng/L    Comment: (NOTE) Elevated high sensitivity troponin I (hsTnI) values and significant  changes  across serial measurements may suggest ACS but many other  chronic and acute conditions are known to elevate hsTnI results.  Refer to the "Links" section for chest pain algorithms and additional  guidance. Performed at [facility], [address], [place], [place] [address]    CT Angio Chest PE W and/or Wo Contrast  Result Date: 07/02/2023 CLINICAL DATA:  Pulmonary embolism (PE) suspected, high prob. Shortness of breath. EXAM: CT ANGIOGRAPHY CHEST WITH CONTRAST TECHNIQUE: Multidetector CT imaging of the chest was performed using the standard protocol during bolus administration of intravenous contrast. Multiplanar CT image reconstructions and MIPs were obtained to evaluate the vascular anatomy. RADIATION DOSE REDUCTION: This exam was performed according to the departmental dose-optimization program which includes automated exposure control, adjustment of the mA and/or kV according to patient size and/or use of iterative reconstruction technique. CONTRAST:  75mL OMNIPAQUE IOHEXOL 350 MG/ML SOLN COMPARISON:  CT chest dated June 21, 2023 and May 19, 2022. FINDINGS: Cardiovascular: Satisfactory opacification of the pulmonary arteries to the segmental level. No evidence of pulmonary embolism. Normal heart size. No pericardial effusion. Multivessel coronary artery calcifications. Atherosclerotic calcification of the thoracic aorta and arch branch vessels. Mediastinum/Nodes: No enlarged mediastinal, hilar, or axillary lymph nodes. Thyroid gland, trachea, and esophagus demonstrate no significant findings. Lungs/Pleura: Upper lobe predominant emphysematous changes. Similar biapical pleural-parenchymal scarring. Linear parenchymal bands at the lung bases may represent atelectasis or scarring. Patchy tree-in-bud nodular opacities within posterior right lower lobe appears similar to slightly more conspicuous compared to the prior exam dated June 21, 2023. No new or enlarging suspicious pulmonary nodule. No  pleural effusion or pneumothorax. Upper Abdomen: No acute abnormality. Musculoskeletal: No acute osseous abnormality. Review of the MIP images confirms the above findings. IMPRESSION: 1. No evidence of pulmonary embolism. 2. Patchy tree-in-bud nodular opacities within the posterior right lower lobe may be secondary to a small airways infectious/inflammatory etiology such as bronchitis. 3. Aortic Atherosclerosis (ICD10-I70.0) and Emphysema (ICD10-J43.9). Electronically Signed   By: Hart Robinsons M.D.   On: 07/02/2023 10:12   DG Chest Port 1 View  Result Date: 07/02/2023 CLINICAL DATA:  67 year old female with history of dyspnea. EXAM: PORTABLE CHEST 1 VIEW COMPARISON:  Chest x-ray 05/19/2022. FINDINGS: Lung volumes are hyperexpanded with advanced emphysematous changes. Blunting of the costophrenic sulci bilaterally, similar to prior studies, likely reflective of chronic pleuroparenchymal scarring. Linear scarring is also noted in the lung apices bilaterally. No consolidative airspace disease. No pleural effusions. No pneumothorax. No pulmonary nodule or mass noted. Pulmonary vasculature and the cardiomediastinal silhouette are within normal limits. Atherosclerosis in the thoracic aorta. IMPRESSION: 1. No radiographic evidence of acute cardiopulmonary disease. 2. Severe emphysema redemonstrated. 3. Aortic atherosclerosis. Electronically Signed   By: Trudie Reed M.D.   On: 07/02/2023 07:16    Pending Labs Unresulted Labs (From admission, onward)     Start     Ordered   07/03/23 0500  Heparin level (unfractionated)  Daily,   R      07/02/23 0753   07/03/23 0500  CBC  Daily,   R      07/02/23 0753   07/03/23 0500  Magnesium  Daily,   R      07/02/23 1059   07/03/23 0500  Basic metabolic panel  Daily,   R      07/02/23 1104   07/03/23 0500  Phosphorus  Tomorrow morning,   R        07/02/23 1104   07/02/23 1500  Heparin level (unfractionated)  Once-Timed,   URGENT        07/02/23 0753    07/02/23 1103  TSH  Add-on,   AD        07/02/23 1104   07/02/23 1102  HIV Antibody (routine testing w rflx)  (HIV Antibody (Routine testing w reflex) panel)  Once,   R        07/02/23 1104            Vitals/Pain Today's Vitals   07/02/23 1030 07/02/23 1045 07/02/23 1146 07/02/23 1148  BP: (!) 134/54 111/79    Pulse:      Resp: 17 (!) 21    Temp:      TempSrc:      SpO2: 93% 91%    Weight:      Height:      PainSc:   0-No pain 0-No pain    Isolation Precautions No active isolations  Medications Medications  diltiazem (CARDIZEM) 1 mg/mL load via infusion 10 mg (10 mg Intravenous Bolus from Bag 07/02/23 0700)    And  diltiazem (CARDIZEM) 125 mg in dextrose 5% 125 mL (1 mg/mL) infusion (5 mg/hr Intravenous Infusion Verify 07/02/23 1150)  heparin ADULT infusion 100 units/mL (25000 units/23mL) (550 Units/hr Intravenous Infusion Verify 07/02/23 1151)  atorvastatin (LIPITOR) tablet 10 mg (has no administration in time range)  ipratropium-albuterol (DUONEB) 0.5-2.5 (3) MG/3ML nebulizer solution 3 mL (has no administration in time range)  acetaminophen (TYLENOL) tablet 650 mg (has no administration in time range)    Or  acetaminophen (TYLENOL) suppository 650 mg (has no administration in time range)  oxyCODONE (Oxy IR/ROXICODONE) immediate release tablet 5 mg (has no administration in time range)  fentaNYL (SUBLIMAZE) injection 12.5 mcg (has no administration in time range)  traZODone (DESYREL) tablet 25 mg (has no administration in time range)  bisacodyl (DULCOLAX) EC tablet 5 mg (has no administration in time range)  ondansetron (ZOFRAN) tablet 4 mg (has no administration in time range)    Or  ondansetron (ZOFRAN) injection 4 mg (has no administration in time range)  pantoprazole (PROTONIX) EC tablet 40 mg (has no administration in time range)  magnesium sulfate IVPB 2 g 50 mL (0 g Intravenous Stopped 07/02/23 1005)  heparin bolus via infusion 2,000 Units (2,000 Units  Intravenous Bolus from Bag 07/02/23 0842)  iohexol (OMNIPAQUE) 350 MG/ML injection 75 mL (75 mLs Intravenous Contrast Given 07/02/23 0845)    Mobility walks     Focused Assessments Cardiac Assessment Handoff:  Cardiac Rhythm: Atrial flutter (HR 73) No results found for: "CKTOTAL", "CKMB", "CKMBINDEX", "TROPONINI" Lab Results  Component Value Date   DDIMER 0.47 05/19/2022   Does the Patient currently have chest pain? No    R Recommendations: See Admitting Provider Note  Report given to:   Additional Notes: A&Ox4, NAD, calm, up to [facility], gets winded when up, heparing and cardizem gtt infusing, 2 IVs, BP soft, MAP strong

## 2023-07-02 NOTE — Progress Notes (Signed)
PHARMACY - ANTICOAGULATION CONSULT NOTE  Pharmacy Consult for heparin Indication: atrial fibrillation  Allergies  Allergen Reactions   Codeine Nausea And Vomiting   Lisinopril Swelling    Patient Measurements: Height: 5\' 4"  (162.6 cm) Weight: 40.7 kg (89 lb 11.6 oz) IBW/kg (Calculated) : 54.7 Heparin Dosing Weight: 39 kg  Vital Signs: Temp: 97.7 F (36.5 C) (10/14 0636) Temp Source: Oral (10/14 0636) BP: 157/67 (10/14 1445) Pulse Rate: 68 (10/14 1445)  Labs: Recent Labs    07/02/23 0640 07/02/23 0658 07/02/23 0843 07/02/23 1428  HGB 17.3* 16.3*  --   --   HCT 50.5* 48.0*  --   --   PLT 257  --   --   --   HEPARINUNFRC  --   --   --  0.18*  CREATININE 0.78 0.80  --   --   TROPONINIHS 8  --  8  --     Estimated Creatinine Clearance: 44.4 mL/min (by C-G formula based on SCr of 0.8 mg/dL).   Medical History: Past Medical History:  Diagnosis Date   Hypertension     Medications:  Medications Prior to Admission  Medication Sig Dispense Refill Last Dose   Ascorbic Acid (VITAMIN C) 1000 MG tablet Take 1,000 mg by mouth daily.   07/01/2023   atorvastatin (LIPITOR) 10 MG tablet Take 1 tablet (10 mg total) by mouth daily for high cholesterol. 90 tablet 4 07/01/2023   chlorthalidone (HYGROTON) 25 MG tablet Take 1 tablet (25 mg total) by mouth every morning. 90 tablet 4 07/01/2023   cholecalciferol (VITAMIN D) 1000 UNITS tablet Take 1,000 Units by mouth daily.   07/01/2023   diltiazem (CARDIZEM CD) 240 MG 24 hr capsule Take 1 capsule (240 mg) by mouth daily. 90 capsule 4 07/01/2023   Ipratropium-Albuterol (COMBIVENT RESPIMAT) 20-100 MCG/ACT AERS respimat Inhale 2 puffs into the lungs every 6 (six) hours as needed. 4 g 12 07/02/2023   potassium chloride SA (KLOR-CON M) 20 MEQ tablet Take 2 tablets (40 mEq total) by mouth daily. 30 tablet 1 07/01/2023   zinc sulfate 220 (50 Zn) MG capsule Take 1 capsule (220 mg total) by mouth daily. 30 capsule 1 07/01/2023   acetaminophen  (TYLENOL) 500 MG tablet Take 1 tablet (500 mg total) by mouth every 4 (four) hours as needed for mild pain or headache (fever >/= 101).       Assessment: Pharmacy consulted to dose heparin in patient with atrial fibrillation.  Patient is not on anticoagulation prior to admission.  HL 0.18- subtherapeutic CBC WNL  Goal of Therapy:  Heparin level 0.3-0.7 units/ml Monitor platelets by anticoagulation protocol: Yes   Plan:  Rebolus 1000 units IV x 1. Increase heparin infusion to 700 units/hr. Heparin level in 6-8 hours and daily. Continue to monitor H&H and platelets.   Judeth Cornfield, PharmD Clinical Pharmacist 07/02/2023 3:14 PM

## 2023-07-02 NOTE — ED Provider Notes (Signed)
[place] EMERGENCY DEPARTMENT AT [facility] Provider Note   CSN: [id number] Arrival date & time: 07/02/23  7829     History  Chief Complaint  Patient presents with   Shortness of Breath    Sophia Mccoy is a 67 y.o. female.  HPI Patient presents for shortness of breath.  Medical history includes HTN, GERD, HLD.  Over the past month, she has had progressive generalized weakness, shortness of breath, and exercise intolerance.  Yesterday, she noticed a rapid heart rate on pulse oximeter, into the 130s.  She states that her baseline heart rate is in the 40s and 50s.  This resolved.  This morning, she had recurrence of tachycardia.  Shortness of breath was worsened.  She denies any recent chest pain.  She is prescribed diltiazem, 240 mg daily.  Last dose was 24 hours ago.    Home Medications Prior to Admission medications   Medication Sig Start Date End Date Taking? Authorizing Provider  acetaminophen (TYLENOL) 500 MG tablet Take 1 tablet (500 mg total) by mouth every 4 (four) hours as needed for mild pain or headache (fever >/= 101). 05/22/22   Vassie Loll, MD  Ascorbic Acid (VITAMIN C) 1000 MG tablet Take 1,000 mg by mouth daily.    [provider]  atorvastatin (LIPITOR) 10 MG tablet Take 1 tablet (10 mg total) by mouth daily for high cholesterol 05/25/22   Vassie Loll, MD  atorvastatin (LIPITOR) 10 MG tablet Take 1 tablet (10 mg total) by mouth daily for high cholesterol. 08/08/22     chlorthalidone (HYGROTON) 25 MG tablet Take 1 tablet (25 mg total) by mouth every morning. 01/31/23     cholecalciferol (VITAMIN D) 1000 UNITS tablet Take 1,000 Units by mouth daily.    [provider]  diltiazem (CARDIZEM CD) 240 MG 24 hr capsule Take 1 capsule (240 mg) by mouth daily. 04/30/23     diltiazem (CARDIZEM CD) 240 MG 24 hr capsule Take 1 capsule (240 mg total) by mouth daily. 04/30/23     feeding supplement (ENSURE ENLIVE / ENSURE PLUS) LIQD Take 237 mLs by  mouth 2 (two) times daily between meals. 05/22/22   Vassie Loll, MD  guaiFENesin-dextromethorphan [facility] DM) 100-10 MG/5ML syrup Take 10 mLs by mouth every 6 (six) hours as needed for cough. 05/22/22   Vassie Loll, MD  Ipratropium-Albuterol (COMBIVENT RESPIMAT) 20-100 MCG/ACT AERS respimat Inhale 2 puffs into the lungs every 6 (six) hours as needed. 06/11/23     Ipratropium-Albuterol (COMBIVENT) 20-100 MCG/ACT AERS respimat Inhale 1 puff into the lungs every 6 (six) hours as needed for wheezing or shortness of breath. 05/22/22   Vassie Loll, MD  nirmatrelvir/ritonavir EUA, renal dosing, (PAXLOVID) 10 x 150 MG & 10 x 100MG  TABS Take 2 tablets by mouth 2 (two) times daily. Complete 1 more day with tablets as provided at discharge (our pharmacy will give to you). 05/22/22   Vassie Loll, MD  pantoprazole (PROTONIX) 40 MG tablet Take 1 tablet (40 mg total) by mouth daily. 05/23/22   Vassie Loll, MD  potassium chloride SA (KLOR-CON M) 20 MEQ tablet Take 2 tablets (40 mEq total) by mouth daily. 05/23/22   Vassie Loll, MD  potassium chloride SA (KLOR-CON M20) 20 MEQ tablet Take 2 tablets (40 mEq total) by mouth daily. 06/12/22     predniSONE (DELTASONE) 20 MG tablet Take 3 tablets by mouth daily x1 day; then 2 tablets by mouth daily x2 days; then 1 tablet by mouth daily x3  days; then half tablet by mouth daily x3 days and stop prednisone. 05/22/22   Vassie Loll, MD  zinc sulfate 220 (50 Zn) MG capsule Take 1 capsule (220 mg total) by mouth daily. 05/23/22   Vassie Loll, MD  diltiazem (CARDIZEM LA) 240 MG 24 hr tablet Take 1 tablet (240 mg total) by mouth daily. 02/16/22 02/17/22        Allergies    Codeine and Lisinopril    Review of Systems   Review of Systems  Constitutional:  Positive for fatigue.  Respiratory:  Positive for shortness of breath.   Cardiovascular:  Positive for palpitations.  Neurological:  Positive for weakness (Generalized).  All other systems reviewed and are  negative.   Physical Exam Updated Vital Signs BP (!) 140/88   Pulse (!) 144   Temp 97.7 F (36.5 C) (Oral)   Resp 16   Ht 5\' 7"  (1.702 m)   Wt 39 kg   SpO2 97%   BMI 13.47 kg/m  Physical Exam Vitals and nursing note reviewed.  Constitutional:      General: She is not in acute distress.    Appearance: She is well-developed and underweight. She is not ill-appearing, toxic-appearing or diaphoretic.  HENT:     Head: Normocephalic and atraumatic.     Mouth/Throat:     Mouth: Mucous membranes are moist.  Eyes:     Conjunctiva/sclera: Conjunctivae normal.  Cardiovascular:     Rate and Rhythm: Regular rhythm. Tachycardia present.     Heart sounds: No murmur heard. Pulmonary:     Effort: Pulmonary effort is normal. Tachypnea present. No accessory muscle usage or respiratory distress.     Breath sounds: Decreased breath sounds present. No wheezing, rhonchi or rales.  Chest:     Chest wall: No tenderness.  Abdominal:     Palpations: Abdomen is soft.     Tenderness: There is no abdominal tenderness.  Musculoskeletal:        General: No swelling. Normal range of motion.     Cervical back: Normal range of motion and neck supple.     Right lower leg: No edema.     Left lower leg: No edema.  Skin:    General: Skin is warm and dry.     Coloration: Skin is not cyanotic or pale.  Neurological:     General: No focal deficit present.     Mental Status: She is alert and oriented to person, place, and time.  Psychiatric:        Mood and Affect: Mood normal.        Behavior: Behavior normal.     ED Results / Procedures / Treatments   Labs (all labs ordered are listed, but only abnormal results are displayed) Labs Reviewed  RESP PANEL BY RT-PCR (RSV, FLU A&B, COVID)  [id number]  COMPREHENSIVE METABOLIC PANEL  BRAIN NATRIURETIC PEPTIDE  BLOOD GAS, VENOUS  CBC WITH DIFFERENTIAL/PLATELET  MAGNESIUM  I-STAT CHEM 8, ED  TROPONIN I (HIGH SENSITIVITY)    EKG EKG  Interpretation Date/Time:  Monday July 02 2023 06:50:08 EDT Ventricular Rate:  143 PR Interval:  79 QRS Duration:  187 QT Interval:  378 QTC Calculation: 584 R Axis:   94  Text Interpretation: atrial flutter with 2:1 conduction RBBB and LPFB Left ventricular hypertrophy Anterior Q waves, possibly due to LVH Baseline wander in lead(s) V4 Confirmed by Gloris Manchester ([id number]) on 07/02/2023 6:57:32 AM  Radiology No results found.  Procedures Procedures    Medications Ordered  in ED Medications  diltiazem (CARDIZEM) 1 mg/mL load via infusion 10 mg (has no administration in time range)    And  diltiazem (CARDIZEM) 125 mg in dextrose 5% 125 mL (1 mg/mL) infusion (has no administration in time range)    ED Course/ Medical Decision Making/ A&P                                 Medical Decision Making Amount and/or Complexity of Data Reviewed Labs: ordered. Radiology: ordered.  Risk Prescription drug management.   This patient presents to the ED for concern of shortness of breath, this involves an extensive number of treatment options, and is a complaint that carries with it a high risk of complications and morbidity.  The differential diagnosis includes arrhythmia, reactive airway disease exacerbation, CHF, pneumonia, neoplasm, anemia, acidosis   Co morbidities that complicate the patient evaluation  HTN, GERD, HLD   Additional history obtained:  Additional history obtained from patient's significant other External records from outside source obtained and reviewed including EMR   Lab Tests:  I Ordered, and personally interpreted labs.  The pertinent results include: Pending at time of signout   Imaging Studies ordered:  I ordered imaging studies including chest x-ray, CTA chest I independently visualized and interpreted imaging which showed pending at time of signout I agree with the radiologist interpretation   Cardiac Monitoring: / EKG:  The patient was maintained  on a cardiac monitor.  I personally viewed and interpreted the cardiac monitored which showed an underlying rhythm of: Atrial flutter   Problem List / ED Course / Critical interventions / Medication management  Patient presenting for progressive shortness of breath, generalized weakness, and exercise intolerance over the past month.  She had onset of tachycardia and palpitations yesterday.  This has been intermittent since then.  Symptoms worsened this morning, prompting her to come to the ED.  On arrival, patient is tachycardic in the 140s.  She is mildly tachypneic.  She is able to speak in complete sentences.  On lung auscultation, she does have diminished breath sounds.  No wheezing is appreciated.  Initial EKG showed an indeterminate rhythm.  Repeat EKG shows what appears to be atrial flutter with 2 1 conduction.  Patient does take diltiazem at baseline.  She has not had her home dose in the past 24 hours.  Diltiazem bolus and gtt. were ordered.  Workup results pending at time of signout.  Care of patient was signed to oncoming ED provider. I ordered medication including diltiazem for rate control; heparin for new atrial flutter Reevaluation of the patient after these medicines showed that the patient stayed the same I have reviewed the patients home medicines and have made adjustments as needed   Social Determinants of Health:  Has access to outpatient care  CRITICAL CARE Performed by: Gloris Manchester   Total critical care time: 34 minutes  Critical care time was exclusive of separately billable procedures and treating other patients.  Critical care was necessary to treat or prevent imminent or life-threatening deterioration.  Critical care was time spent personally by me on the following activities: development of treatment plan with patient and/or surrogate as well as nursing, discussions with consultants, evaluation of patient's response to treatment, examination of patient, obtaining  history from patient or surrogate, ordering and performing treatments and interventions, ordering and review of laboratory studies, ordering and review of radiographic studies, pulse oximetry and re-evaluation of  patient's condition.        Final Clinical Impression(s) / ED Diagnoses Final diagnoses:  SOB (shortness of breath)  Generalized weakness  Atrial flutter with rapid ventricular response [facility])    Rx / DC Orders ED Discharge Orders     None         Gloris Manchester, MD 07/02/23 [phone]

## 2023-07-02 NOTE — ED Provider Notes (Signed)
  Provider Note MRN:  [id number]  Arrival date & time: 07/02/23    ED Course and Medical Decision Making  Assumed care from [facility] at shift change.  See note from prior team for complete details, in brief:  67 yo female Dib x1 month Symptom intermittent  Dilt gtt Unclear onset of flutter  Plan per prior physician f/u labs, plan admission  Clinical Course as of 07/02/23 1111  Mon Jul 02, 2023  1005 Symptoms improving, HR Improved [SG]  1005 Magnesium(!): 1.5 replace [SG]  1025 CTA looks okay, no PE [SG]  1043 She is feeling better, she is rate controlled but still in flutter.  Plan for admission, cardiology consult [SG]    Clinical Course User Index [SG] Sloan Leiter, DO    Tolerating Dilt well, now rate controlled and symptoms improved Admit to hospitalist now onset a-flutter [facility] w/ cardiology Dr Wyline Mood for consult     .Critical Care  Performed by: Sloan Leiter, DO Authorized by: Sloan Leiter, DO   Critical care provider statement:    Critical care time (minutes):  30   Critical care time was exclusive of:  Separately billable procedures and treating other patients   Critical care was necessary to treat or prevent imminent or life-threatening deterioration of the following conditions:  Cardiac failure   Critical care was time spent personally by me on the following activities:  Development of treatment plan with patient or surrogate, discussions with consultants, evaluation of patient's response to treatment, examination of patient, ordering and review of laboratory studies, ordering and review of radiographic studies, ordering and performing treatments and interventions, pulse oximetry, re-evaluation of patient's condition, review of old charts and obtaining history from patient or surrogate   Care discussed with: admitting provider     Final Clinical Impressions(s) / ED Diagnoses     ICD-10-CM   1. Atrial flutter with rapid ventricular response (HCC)  I48.92      2. SOB (shortness of breath)  R06.02     3. Generalized weakness  R53.1     4. Hypomagnesemia  E83.42       ED Discharge Orders     None       Discharge Instructions   None        Sloan Leiter, DO 07/02/23 1111

## 2023-07-02 NOTE — ED Triage Notes (Signed)
Pt states she had covid last year and has been progressively more short of breath since; this morning she noticed a significant change when she walked a short distance and could not catch her breath, felt "shaky" and felt like her heart was racing so she came to the ED. Sophia Mccoy

## 2023-07-02 NOTE — Consult Note (Addendum)
Cardiology Consultation   Patient ID: KEYANNAH BOODOO MRN: [id number]; DOB: Aug 01, 1956  Admit date: 07/02/2023 Date of Consult: 07/02/2023  PCP:  Carylon Perches, MD   [facility] Providers Cardiologist:  None        Patient Profile:   MICHELL PACHON is a 67 y.o. female with a hx of HTN, HLD, GERD who is being seen 07/02/2023 for the evaluation of Aflutter with RVR at the request of Dr. Laural Benes.  History of Present Illness:   Ms. Mcneary presents with generalized weakness, SOB and exercise intolerance for 1 month. Yesterday she developed palpitations and HR 130/m on home pulse ox. HR normally 40-50/m. HR has come down with IV diltiazem and started on IV heparin.Mg 1.5, BNP 196.She takes diltiazem 240 mg daily at home for HTN as well as chlorthalidone. She's had angioedema on lisinopril. She works in [employer]. She has had trouble with weight loss(128 lbs down to 86 lbs) and SOB since covid 19 last year. Former smoker, has never seen pulmonary. Doesn't drink alcohol.    Past Medical History:  Diagnosis Date   Hypertension     Past Surgical History:  Procedure Laterality Date   ABDOMINAL HYSTERECTOMY     TEMPOROMANDIBULAR JOINT SURGERY     TONSILLECTOMY       Home Medications:  Prior to Admission medications   Medication Sig Start Date End Date Taking? Authorizing Provider  Ascorbic Acid (VITAMIN C) 1000 MG tablet Take 1,000 mg by mouth daily.   Yes [provider]  atorvastatin (LIPITOR) 10 MG tablet Take 1 tablet (10 mg total) by mouth daily for high cholesterol. 08/08/22  Yes   chlorthalidone (HYGROTON) 25 MG tablet Take 1 tablet (25 mg total) by mouth every morning. 01/31/23  Yes   cholecalciferol (VITAMIN D) 1000 UNITS tablet Take 1,000 Units by mouth daily.   Yes [provider]  diltiazem (CARDIZEM CD) 240 MG 24 hr capsule Take 1 capsule (240 mg) by mouth daily. 04/30/23  Yes   Ipratropium-Albuterol (COMBIVENT RESPIMAT) 20-100 MCG/ACT  AERS respimat Inhale 2 puffs into the lungs every 6 (six) hours as needed. 06/11/23  Yes   potassium chloride SA (KLOR-CON M) 20 MEQ tablet Take 2 tablets (40 mEq total) by mouth daily. 05/23/22  Yes Vassie Loll, MD  zinc sulfate 220 (50 Zn) MG capsule Take 1 capsule (220 mg total) by mouth daily. 05/23/22  Yes Vassie Loll, MD  acetaminophen (TYLENOL) 500 MG tablet Take 1 tablet (500 mg total) by mouth every 4 (four) hours as needed for mild pain or headache (fever >/= 101). 05/22/22   Vassie Loll, MD  diltiazem (CARDIZEM LA) 240 MG 24 hr tablet Take 1 tablet (240 mg total) by mouth daily. 02/16/22 02/17/22      Inpatient Medications: Scheduled Meds:  atorvastatin  10 mg Oral QPM   pantoprazole  40 mg Oral QHS   Continuous Infusions:  diltiazem (CARDIZEM) infusion 5 mg/hr (07/02/23 1150)   heparin 550 Units/hr (07/02/23 1151)   PRN Meds: acetaminophen **OR** acetaminophen, bisacodyl, fentaNYL (SUBLIMAZE) injection, ipratropium-albuterol, ondansetron **OR** ondansetron (ZOFRAN) IV, oxyCODONE, traZODone  Allergies:    Allergies  Allergen Reactions   Codeine Nausea And Vomiting   Lisinopril Swelling    Social History:   Social History   Socioeconomic History   Marital status: Married    Spouse name: Not on file   Number of children: Not on file   Years of education: Not on file   Highest education  level: Not on file  Occupational History   Not on file  Tobacco Use   Smoking status: Never   Smokeless tobacco: Not on file  Vaping Use   Vaping status: Never Used  Substance and Sexual Activity   Alcohol use: No   Drug use: No   Sexual activity: Not on file  Other Topics Concern   Not on file  Social History Narrative   Not on file   Social Determinants of Health   Financial Resource Strain: Not on file  Food Insecurity: Not on file  Transportation Needs: Not on file  Physical Activity: Not on file  Stress: Not on file  Social Connections: Not on file  Intimate  Partner Violence: Not on file    Family History:    History reviewed. No pertinent family history.   ROS:  Please see the history of present illness.  Review of Systems  Constitutional: Negative.  HENT: Negative.    Eyes: Negative.   Cardiovascular:  Positive for dyspnea on exertion, irregular heartbeat and palpitations.  Respiratory:  Positive for shortness of breath.   Hematologic/Lymphatic: Negative.   Musculoskeletal: Negative.  Negative for joint pain.  Gastrointestinal: Negative.   Genitourinary: Negative.   Neurological: Negative.     All other ROS reviewed and negative.     Physical Exam/Data:   Vitals:   07/02/23 1200 07/02/23 1215 07/02/23 1230 07/02/23 1245  BP: (!) 84/71 101/83 121/69   Pulse: 71 72 73 72  Resp: 15 19 (!) 23 14  Temp:      TempSrc:      SpO2: 94% 94% 97% 95%  Weight:      Height:        Intake/Output Summary (Last 24 hours) at 07/02/2023 1300 Last data filed at 07/02/2023 1005 Gross per 24 hour  Intake 50 ml  Output --  Net 50 ml      07/02/2023    6:34 AM 05/19/2022    6:02 PM 05/19/2022    9:16 AM  Last 3 Weights  Weight (lbs) 86 lb 83 lb 15.9 oz 84 lb 6.4 oz  Weight (kg) 39.009 kg 38.1 kg 38.284 kg     Body mass index is 13.47 kg/m.  General:  Thin, looks older than stated age, short of breath I  HEENT: normal Neck: no JVD Vascular: No carotid bruits; Distal pulses 2+ bilaterally Cardiac:  normal S1, S2; RRR; no murmur  distant HS Lungs:  decreased breath sounds throughout, no wheezing, rhonchi or rales  Abd: soft, nontender, no hepatomegaly  Ext: no edema Musculoskeletal:  No deformities, BUE and BLE strength normal and equal Skin: warm and dry  Neuro:  CNs 2-12 intact, no focal abnormalities noted Psych:  Normal affect   EKG:  The EKG was personally reviewed and demonstrates:  Aflutter with RVR, RBBB, LVH Telemetry:  Telemetry was personally reviewed and demonstrates:  Aflutter 78/m  Relevant CV Studies:     Laboratory Data:  High Sensitivity Troponin:   Recent Labs  Lab 07/02/23 0640 07/02/23 0843  TROPONINIHS 8 8     Chemistry Recent Labs  Lab 07/02/23 0640 07/02/23 0658  NA 132* 134*  K 3.9 3.9  CL 96* 100  CO2 26  --   GLUCOSE 151* 139*  BUN 20 20  CREATININE 0.78 0.80  CALCIUM 9.1  --   MG 1.5*  --   GFRNONAA >60  --   ANIONGAP 10  --     Recent Labs  Lab  07/02/23 0640  PROT 6.8  ALBUMIN 3.9  AST 19  ALT 21  ALKPHOS 54  BILITOT 0.8   Lipids No results for input(s): "CHOL", "TRIG", "HDL", "LABVLDL", "LDLCALC", "CHOLHDL" in the last 168 hours.  Hematology Recent Labs  Lab 07/02/23 0640 07/02/23 0658  WBC 9.5  --   RBC 5.76*  --   HGB 17.3* 16.3*  HCT 50.5* 48.0*  MCV 87.7  --   MCH 30.0  --   MCHC 34.3  --   RDW 12.7  --   PLT 257  --    Thyroid  Recent Labs  Lab 07/02/23 0843  TSH 2.045    BNP Recent Labs  Lab 07/02/23 0640  BNP 196.0*    DDimer No results for input(s): "DDIMER" in the last 168 hours.   Radiology/Studies:  CT Angio Chest PE W and/or Wo Contrast  Result Date: 07/02/2023 CLINICAL DATA:  Pulmonary embolism (PE) suspected, high prob. Shortness of breath. EXAM: CT ANGIOGRAPHY CHEST WITH CONTRAST TECHNIQUE: Multidetector CT imaging of the chest was performed using the standard protocol during bolus administration of intravenous contrast. Multiplanar CT image reconstructions and MIPs were obtained to evaluate the vascular anatomy. RADIATION DOSE REDUCTION: This exam was performed according to the departmental dose-optimization program which includes automated exposure control, adjustment of the mA and/or kV according to patient size and/or use of iterative reconstruction technique. CONTRAST:  75mL OMNIPAQUE IOHEXOL 350 MG/ML SOLN COMPARISON:  CT chest dated June 21, 2023 and May 19, 2022. FINDINGS: Cardiovascular: Satisfactory opacification of the pulmonary arteries to the segmental level. No evidence of pulmonary embolism.  Normal heart size. No pericardial effusion. Multivessel coronary artery calcifications. Atherosclerotic calcification of the thoracic aorta and arch Max Romano vessels. Mediastinum/Nodes: No enlarged mediastinal, hilar, or axillary lymph nodes. Thyroid gland, trachea, and esophagus demonstrate no significant findings. Lungs/Pleura: Upper lobe predominant emphysematous changes. Similar biapical pleural-parenchymal scarring. Linear parenchymal bands at the lung bases may represent atelectasis or scarring. Patchy tree-in-bud nodular opacities within posterior right lower lobe appears similar to slightly more conspicuous compared to the prior exam dated June 21, 2023. No new or enlarging suspicious pulmonary nodule. No pleural effusion or pneumothorax. Upper Abdomen: No acute abnormality. Musculoskeletal: No acute osseous abnormality. Review of the MIP images confirms the above findings. IMPRESSION: 1. No evidence of pulmonary embolism. 2. Patchy tree-in-bud nodular opacities within the posterior right lower lobe may be secondary to a small airways infectious/inflammatory etiology such as bronchitis. 3. Aortic Atherosclerosis (ICD10-I70.0) and Emphysema (ICD10-J43.9). Electronically Signed   By: Hart Robinsons M.D.   On: 07/02/2023 10:12   DG Chest Port 1 View  Result Date: 07/02/2023 CLINICAL DATA:  68 year old female with history of dyspnea. EXAM: PORTABLE CHEST 1 VIEW COMPARISON:  Chest x-ray 05/19/2022. FINDINGS: Lung volumes are hyperexpanded with advanced emphysematous changes. Blunting of the costophrenic sulci bilaterally, similar to prior studies, likely reflective of chronic pleuroparenchymal scarring. Linear scarring is also noted in the lung apices bilaterally. No consolidative airspace disease. No pleural effusions. No pneumothorax. No pulmonary nodule or mass noted. Pulmonary vasculature and the cardiomediastinal silhouette are within normal limits. Atherosclerosis in the thoracic aorta. IMPRESSION:  1. No radiographic evidence of acute cardiopulmonary disease. 2. Severe emphysema redemonstrated. 3. Aortic atherosclerosis. Electronically Signed   By: Trudie Reed M.D.   On: 07/02/2023 07:16     Assessment and Plan:   Aflutter with RVR-could have had for a month.HR slowing with IV diltiazem but she's still uncomfortable with shortness of breath. Consider TEE DCCV  in am vs starting IV amiodarone. IV heparin started.  HTN controlled with diltiazem 240 mg daily and chlorthalidone 25 mg daily but held due to low Mg. Angioedema with lisinopril  HLD on lipitor  COPD-former smoker but worsening DOE since covid 19 last year-needs to see pulmonary  Failure to thrive with >40 lb weight loss since covid 19 infection   Aortic atherosclerosis noted on CT   Risk Assessment/Risk Scores:          CHA2DS2-VASc Score = 4   This indicates a 4.8% annual risk of stroke. The patient's score is based upon: CHF History: 0 HTN History: 1 Diabetes History: 0 Stroke History: 0 Vascular Disease History: 1 Age Score: 1 Gender Score: 1         For questions or updates, please contact [facility] Please consult [email or web address] for contact info under    Signed, Jacolyn Reedy, PA-C  07/02/2023 1:00 PM   Attending note Patient seen and discussed with PA Geni Bers, I agree with her documentation. 67 yo female history of HTN, HLD, presents with generalized weakness and SOB. Found to be in aflutter with RVR, a new diagnosis for the patient.    K 3.9 BUN 20 Cr 0.78 BNP 196 WBC 9.5 Hgb 17.3 Plt 257 Mg 1.5 TSH 2 Trop 8-->8 EKG: aflutter, typical with RVR.  CXR severe emphyseam, no acute process CT PE: no PE, possible bronchitis  1.New onset aflutter, RVR - new diagnosis of aflutter this admission - started on dilt gtt, continue to day and transition to oral regimen tomorrow. - CHADS2Vasc score is at least 3 (gender, age, HTN), startd on hep gtt. Transition to oral DOAC likely  tomorrow - npo tonight in case difficult to rate control and requires TEE/DCCV tomorrow - echo when rates controlled   Dina Rich MD

## 2023-07-02 NOTE — Hospital Course (Signed)
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  This has been progressive over the past month.  Yesterday she noticed palpitations with findings of tachycardia into the 130s on her home pulse oximeter.  She reports that her heart rate is normally in the 40-50 range.  She normally takes diltiazem to 40 mg and has taken it consistently.  She denies having chest pain.  Her EKG demonstrated atrial flutter with RVR.  She was started on IV diltiazem bolus and infusion in the emergency department and this has gotten her heart rate under better control and now is in the 70 range but she remains in atrial flutter.  Due to her elevated CHA2DS2-VASc score she was started on IV heparin for full anticoagulation and cardiology was consulted.

## 2023-07-02 NOTE — ED Notes (Signed)
Pt to CT

## 2023-07-02 NOTE — Plan of Care (Signed)
  Problem: Education: Goal: Knowledge of General Education information will improve Description: Including pain rating scale, medication(s)/side effects and non-pharmacologic comfort measures 07/02/2023 1456 by Markham Jordan, RN Outcome: Progressing 07/02/2023 1454 by Markham Jordan, RN Outcome: Progressing   Problem: Health Behavior/Discharge Planning: Goal: Ability to manage health-related needs will improve 07/02/2023 1456 by Markham Jordan, RN Outcome: Progressing 07/02/2023 1454 by Markham Jordan, RN Outcome: Progressing   Problem: Clinical Measurements: Goal: Ability to maintain clinical measurements within normal limits will improve 07/02/2023 1456 by Markham Jordan, RN Outcome: Progressing 07/02/2023 1454 by Markham Jordan, RN Outcome: Progressing Goal: Will remain free from infection 07/02/2023 1456 by Markham Jordan, RN Outcome: Progressing 07/02/2023 1454 by Markham Jordan, RN Outcome: Progressing Goal: Diagnostic test results will improve 07/02/2023 1456 by Markham Jordan, RN Outcome: Progressing 07/02/2023 1454 by Markham Jordan, RN Outcome: Progressing Goal: Respiratory complications will improve 07/02/2023 1456 by Markham Jordan, RN Outcome: Progressing 07/02/2023 1454 by Markham Jordan, RN Outcome: Progressing Goal: Cardiovascular complication will be avoided 07/02/2023 1456 by Markham Jordan, RN Outcome: Progressing 07/02/2023 1454 by Markham Jordan, RN Outcome: Progressing   Problem: Activity: Goal: Risk for activity intolerance will decrease 07/02/2023 1456 by Markham Jordan, RN Outcome: Progressing 07/02/2023 1454 by Markham Jordan, RN Outcome: Progressing   Problem: Nutrition: Goal: Adequate nutrition will be maintained 07/02/2023 1456 by Markham Jordan, RN Outcome: Progressing 07/02/2023 1454 by Markham Jordan, RN Outcome: Progressing   Problem: Coping: Goal: Level of anxiety will decrease 07/02/2023  1456 by Markham Jordan, RN Outcome: Progressing 07/02/2023 1454 by Markham Jordan, RN Outcome: Progressing   Problem: Elimination: Goal: Will not experience complications related to bowel motility 07/02/2023 1456 by Markham Jordan, RN Outcome: Progressing 07/02/2023 1454 by Markham Jordan, RN Outcome: Progressing Goal: Will not experience complications related to urinary retention 07/02/2023 1456 by Markham Jordan, RN Outcome: Progressing 07/02/2023 1454 by Markham Jordan, RN Outcome: Progressing   Problem: Pain Managment: Goal: General experience of comfort will improve 07/02/2023 1456 by Markham Jordan, RN Outcome: Progressing 07/02/2023 1454 by Markham Jordan, RN Outcome: Progressing   Problem: Safety: Goal: Ability to remain free from injury will improve 07/02/2023 1456 by Markham Jordan, RN Outcome: Progressing 07/02/2023 1454 by Markham Jordan, RN Outcome: Progressing   Problem: Skin Integrity: Goal: Risk for impaired skin integrity will decrease 07/02/2023 1456 by Markham Jordan, RN Outcome: Progressing 07/02/2023 1454 by Markham Jordan, RN Outcome: Progressing   Problem: Education: Goal: Knowledge of disease or condition will improve Outcome: Progressing Goal: Understanding of medication regimen will improve Outcome: Progressing Goal: Individualized Educational Video(s) Outcome: Progressing   Problem: Activity: Goal: Ability to tolerate increased activity will improve Outcome: Progressing   Problem: Cardiac: Goal: Ability to achieve and maintain adequate cardiopulmonary perfusion will improve Outcome: Progressing   Problem: Health Behavior/Discharge Planning: Goal: Ability to safely manage health-related needs after discharge will improve Outcome: Progressing

## 2023-07-02 NOTE — Progress Notes (Addendum)
RN called this RT to patient's room.  Patient was having increased WOB and felt like she could not move any air.  Patient did have increased WOB, RN heard slight wheeze, I heard Diminished BS.  Treatment given.  Watching patient's HR and rhythm, patient has history of AFlutter with RVR, and patient is getting PRN Duoneb.

## 2023-07-02 NOTE — Progress Notes (Signed)
PHARMACY - ANTICOAGULATION CONSULT NOTE  Pharmacy Consult for heparin Indication: atrial fibrillation  Allergies  Allergen Reactions   Codeine Nausea And Vomiting   Lisinopril Swelling    Patient Measurements: Height: 5\' 4"  (162.6 cm) Weight: 40.7 kg (89 lb 11.6 oz) IBW/kg (Calculated) : 54.7 Heparin Dosing Weight: 39 kg  Vital Signs: Temp: 97.9 F (36.6 C) (10/14 1934) Temp Source: Oral (10/14 1934) BP: 97/62 (10/14 2100) Pulse Rate: 73 (10/14 2100)  Labs: Recent Labs    07/02/23 0640 07/02/23 0658 07/02/23 0756 07/02/23 0843 07/02/23 1428 07/02/23 2207  HGB 17.3* 16.3* 16.0*  --   --   --   HCT 50.5* 48.0* 47.0*  --   --   --   PLT 257  --   --   --   --   --   HEPARINUNFRC  --   --   --   --  0.18* 0.43  CREATININE 0.78 0.80 0.60  --   --   --   TROPONINIHS 8  --   --  8  --   --     Estimated Creatinine Clearance: 44.4 mL/min (by C-G formula based on SCr of 0.6 mg/dL).   Medical History: Past Medical History:  Diagnosis Date   Hypertension     Medications:  Medications Prior to Admission  Medication Sig Dispense Refill Last Dose   Ascorbic Acid (VITAMIN C) 1000 MG tablet Take 1,000 mg by mouth daily.   07/01/2023   atorvastatin (LIPITOR) 10 MG tablet Take 1 tablet (10 mg total) by mouth daily for high cholesterol. 90 tablet 4 07/01/2023   chlorthalidone (HYGROTON) 25 MG tablet Take 1 tablet (25 mg total) by mouth every morning. 90 tablet 4 07/01/2023   cholecalciferol (VITAMIN D) 1000 UNITS tablet Take 1,000 Units by mouth daily.   07/01/2023   diltiazem (CARDIZEM CD) 240 MG 24 hr capsule Take 1 capsule (240 mg) by mouth daily. 90 capsule 4 07/01/2023   Ipratropium-Albuterol (COMBIVENT RESPIMAT) 20-100 MCG/ACT AERS respimat Inhale 2 puffs into the lungs every 6 (six) hours as needed. 4 g 12 07/02/2023   potassium chloride SA (KLOR-CON M) 20 MEQ tablet Take 2 tablets (40 mEq total) by mouth daily. 30 tablet 1 07/01/2023   zinc sulfate 220 (50 Zn) MG  capsule Take 1 capsule (220 mg total) by mouth daily. 30 capsule 1 07/01/2023   acetaminophen (TYLENOL) 500 MG tablet Take 1 tablet (500 mg total) by mouth every 4 (four) hours as needed for mild pain or headache (fever >/= 101).       Assessment: Pharmacy consulted to dose heparin in patient with atrial fibrillation.  Patient is not on anticoagulation prior to admission.  Heparin level 0.43 - therapeutic on heparin 700 units/hr. No bleeding or issues with the infusion per RN.  Goal of Therapy:  Heparin level 0.3-0.7 units/ml Monitor platelets by anticoagulation protocol: Yes   Plan:  Continue heparin infusion at 700 units/hr. Heparin level in 6-8 hours and daily. Continue to monitor H&H and platelets.   Loralee Pacas, PharmD, BCPS 07/02/2023 10:28 PM

## 2023-07-02 NOTE — H&P (Signed)
History and Physical  [facility]  Sophia Mccoy WJX:[id number] DOB: 1955/11/26 DOA: 07/02/2023  PCP: Carylon Perches, MD  Patient coming from: Home  Level of care: Stepdown  I have personally briefly reviewed patient's old medical records in [facility]  Chief Complaint: weakness and palpitations  HPI: Sophia Mccoy is a 67 y/o never smoker, with hypertension, hyperlipidemia, GERD, h/o of Covid infection in 2023, 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  This has been progressive over the past month.  Yesterday she noticed palpitations with findings of tachycardia into the 130s on her home pulse oximeter.  She reports that her heart rate is normally in the 40-50 range.  She normally takes diltiazem to 40 mg and has taken it consistently.  She denies having chest pain.  Her EKG demonstrated atrial flutter with RVR.  She was started on IV diltiazem bolus and infusion in the emergency department and this has gotten her heart rate under better control and now is in the 70 range but she remains in atrial flutter.  Due to her elevated CHA2DS2-VASc score she was started on IV heparin for full anticoagulation and cardiology was consulted.    Past Medical History:  Diagnosis Date   Hypertension     Past Surgical History:  Procedure Laterality Date   ABDOMINAL HYSTERECTOMY     TEMPOROMANDIBULAR JOINT SURGERY     TONSILLECTOMY       reports that she has never smoked. She does not have any smokeless tobacco history on file. She reports that she does not drink alcohol and does not use drugs.  Allergies  Allergen Reactions   Codeine Nausea And Vomiting   Lisinopril Swelling    History reviewed. No pertinent family history.  Prior to Admission medications   Medication Sig Start Date End Date Taking? Authorizing Provider  Ascorbic Acid  (VITAMIN C) 1000 MG tablet Take 1,000 mg by mouth daily.   Yes [provider]  atorvastatin (LIPITOR) 10 MG tablet Take 1 tablet (10 mg total) by mouth daily for high cholesterol. 08/08/22  Yes   chlorthalidone (HYGROTON) 25 MG tablet Take 1 tablet (25 mg total) by mouth every morning. 01/31/23  Yes   cholecalciferol (VITAMIN D) 1000 UNITS tablet Take 1,000 Units by mouth daily.   Yes [provider]  diltiazem (CARDIZEM CD) 240 MG 24 hr capsule Take 1 capsule (240 mg) by mouth daily. 04/30/23  Yes   Ipratropium-Albuterol (COMBIVENT RESPIMAT) 20-100 MCG/ACT AERS respimat Inhale 2 puffs into the lungs every 6 (six) hours as needed. 06/11/23  Yes   potassium chloride SA (KLOR-CON M) 20 MEQ tablet Take 2 tablets (40 mEq total) by mouth daily. 05/23/22  Yes Vassie Loll, MD  zinc sulfate 220 (50 Zn) MG capsule Take 1 capsule (220 mg total) by mouth daily. 05/23/22  Yes Vassie Loll, MD  acetaminophen (TYLENOL) 500 MG tablet Take 1 tablet (500 mg total) by mouth every 4 (four) hours as needed for mild pain or headache (fever >/= 101). 05/22/22   Vassie Loll, MD  diltiazem (CARDIZEM LA) 240 MG 24 hr tablet Take 1 tablet (240 mg total) by mouth daily. 02/16/22 02/17/22      Physical Exam: Vitals:   07/02/23 1000 07/02/23 1015 07/02/23 1030 07/02/23 1045  BP: (!) 102/56 (!) 102/59 (!) 134/54 111/79  Pulse: 70     Resp: 17 18 17  (!) 21  Temp:      TempSrc:      SpO2: 92% 90% 93% 91%  Weight:      Height:        Constitutional: frail, elderly female, appears cachectic, NAD, calm, comfortable Eyes: PERRL, lids and conjunctivae normal ENMT: Mucous membranes are moist. Posterior pharynx clear of any exudate or lesions. Normal dentition.  Neck: normal, supple, no masses, no thyromegaly Respiratory: clear to auscultation bilaterally, no wheezing, no crackles. Normal respiratory effort. No accessory muscle use.  Cardiovascular: irregularly irregular, normal s1, s2 sounds, no murmurs / rubs  / gallops. No extremity edema. 2+ pedal pulses. No carotid bruits.  Abdomen: no tenderness, no masses palpated. No hepatosplenomegaly. Bowel sounds positive.  Musculoskeletal: no clubbing / cyanosis. No joint deformity upper and lower extremities. Good ROM, no contractures. Normal muscle tone.  Skin: no rashes, lesions, ulcers. No induration Neurologic: CN 2-12 grossly intact. Sensation intact, DTR normal. Strength 5/5 in all 4.  Psychiatric: Normal judgment and insight. Alert and oriented x 3. Normal mood.   Labs on Admission: I have personally reviewed following labs and imaging studies  CBC: Recent Labs  Lab 07/02/23 0640 07/02/23 0658  WBC 9.5  --   NEUTROABS 7.4  --   HGB 17.3* 16.3*  HCT 50.5* 48.0*  MCV 87.7  --   PLT 257  --    Basic Metabolic Panel: Recent Labs  Lab 07/02/23 0640 07/02/23 0658  NA 132* 134*  K 3.9 3.9  CL 96* 100  CO2 26  --   GLUCOSE 151* 139*  BUN 20 20  CREATININE 0.78 0.80  CALCIUM 9.1  --   MG 1.5*  --    GFR: Estimated Creatinine Clearance: 42.6 mL/min (by C-G formula based on SCr of 0.8 mg/dL). Liver Function Tests: Recent Labs  Lab 07/02/23 0640  AST 19  ALT 21  ALKPHOS 54  BILITOT 0.8  PROT 6.8  ALBUMIN 3.9   No results for input(s): "LIPASE", "AMYLASE" in the last 168 hours. No results for input(s): "AMMONIA" in the last 168 hours. Coagulation Profile: No results for input(s): "INR", "PROTIME" in the last 168 hours. Cardiac Enzymes: No results for input(s): "CKTOTAL", "CKMB", "CKMBINDEX", "TROPONINI" in the last 168 hours. BNP (last 3 results) No results for input(s): "PROBNP" in the last 8760 hours. HbA1C: No results for input(s): "HGBA1C" in the last 72 hours. CBG: No results for input(s): "GLUCAP" in the last 168 hours. Lipid Profile: No results for input(s): "CHOL", "HDL", "LDLCALC", "TRIG", "CHOLHDL", "LDLDIRECT" in the last 72 hours. Thyroid Function Tests: No results for input(s): "TSH", "T4TOTAL", "FREET4",  "T3FREE", "THYROIDAB" in the last 72 hours. Anemia Panel: No results for input(s): "VITAMINB12", "FOLATE", "FERRITIN", "TIBC", "IRON", "RETICCTPCT" in the last 72 hours. Urine analysis:    Component Value Date/Time   COLORURINE YELLOW 05/19/2022 1352   APPEARANCEUR HAZY (A) 05/19/2022 1352   LABSPEC 1.016 05/19/2022 1352   PHURINE 5.0 05/19/2022 1352   GLUCOSEU NEGATIVE 05/19/2022 1352   HGBUR SMALL (A) 05/19/2022 1352   BILIRUBINUR NEGATIVE 05/19/2022 1352   KETONESUR 80 (A) 05/19/2022 1352   PROTEINUR 30 (A) 05/19/2022 1352   NITRITE NEGATIVE 05/19/2022 1352   LEUKOCYTESUR NEGATIVE 05/19/2022 1352    Radiological Exams on Admission: CT Angio Chest PE W and/or Wo Contrast  Result Date: 07/02/2023 CLINICAL DATA:  Pulmonary embolism (PE) suspected, high prob. Shortness of breath. EXAM: CT ANGIOGRAPHY CHEST WITH CONTRAST TECHNIQUE: Multidetector CT imaging of the chest was performed using the standard protocol during  bolus administration of intravenous contrast. Multiplanar CT image reconstructions and MIPs were obtained to evaluate the vascular anatomy. RADIATION DOSE REDUCTION: This exam was performed according to the departmental dose-optimization program which includes automated exposure control, adjustment of the mA and/or kV according to patient size and/or use of iterative reconstruction technique. CONTRAST:  75mL OMNIPAQUE IOHEXOL 350 MG/ML SOLN COMPARISON:  CT chest dated June 21, 2023 and May 19, 2022. FINDINGS: Cardiovascular: Satisfactory opacification of the pulmonary arteries to the segmental level. No evidence of pulmonary embolism. Normal heart size. No pericardial effusion. Multivessel coronary artery calcifications. Atherosclerotic calcification of the thoracic aorta and arch branch vessels. Mediastinum/Nodes: No enlarged mediastinal, hilar, or axillary lymph nodes. Thyroid gland, trachea, and esophagus demonstrate no significant findings. Lungs/Pleura: Upper lobe  predominant emphysematous changes. Similar biapical pleural-parenchymal scarring. Linear parenchymal bands at the lung bases may represent atelectasis or scarring. Patchy tree-in-bud nodular opacities within posterior right lower lobe appears similar to slightly more conspicuous compared to the prior exam dated June 21, 2023. No new or enlarging suspicious pulmonary nodule. No pleural effusion or pneumothorax. Upper Abdomen: No acute abnormality. Musculoskeletal: No acute osseous abnormality. Review of the MIP images confirms the above findings. IMPRESSION: 1. No evidence of pulmonary embolism. 2. Patchy tree-in-bud nodular opacities within the posterior right lower lobe may be secondary to a small airways infectious/inflammatory etiology such as bronchitis. 3. Aortic Atherosclerosis (ICD10-I70.0) and Emphysema (ICD10-J43.9). Electronically Signed   By: Hart Robinsons M.D.   On: 07/02/2023 10:12   DG Chest Port 1 View  Result Date: 07/02/2023 CLINICAL DATA:  67 year old female with history of dyspnea. EXAM: PORTABLE CHEST 1 VIEW COMPARISON:  Chest x-ray 05/19/2022. FINDINGS: Lung volumes are hyperexpanded with advanced emphysematous changes. Blunting of the costophrenic sulci bilaterally, similar to prior studies, likely reflective of chronic pleuroparenchymal scarring. Linear scarring is also noted in the lung apices bilaterally. No consolidative airspace disease. No pleural effusions. No pneumothorax. No pulmonary nodule or mass noted. Pulmonary vasculature and the cardiomediastinal silhouette are within normal limits. Atherosclerosis in the thoracic aorta. IMPRESSION: 1. No radiographic evidence of acute cardiopulmonary disease. 2. Severe emphysema redemonstrated. 3. Aortic atherosclerosis. Electronically Signed   By: Trudie Reed M.D.   On: 07/02/2023 07:16    EKG: Independently reviewed. Atrial flutter 2:1 with RVR   Assessment/Plan Principal Problem:   Atrial flutter with rapid ventricular  response (HCC) Active Problems:   Essential hypertension   Hyperlipidemia   GERD (gastroesophageal reflux disease)   Hypomagnesemia   Atrial Flutter with RVR  - rate is much better controlled on IV diltiazem infusion  -CHA2DS2-VASc score of at least 3 and started on IV heparin infusion for full anticoagulation - check TSH - replace Magnesium  - obtain TTE when HR better controlled - cardiology consultation requested  - place in stepdown ICU  - further recommendations to follow   Essential hypertension  - holding home chlorthalidone in setting of electrolyte abnormalities - remains on IV diltiazem infusion   Hypomagnesemia - IV replacement ordered - recheck in AM   Hyperlipidemia - resumed home atorvastatin every evening  - Lipids are optimally controlled at this time  GERD - pantoprazole ordered for GI protection   Critical Care Procedure Note Authorized and Performed by: Maryln Manuel MD  Total Critical Care time:  55 mins Due to a high probability of clinically significant, life threatening deterioration, the patient required my highest level of preparedness to intervene emergently and I personally spent this critical care time directly and personally managing  the patient.  This critical care time included obtaining a history; examining the patient, pulse oximetry; ordering and review of studies; arranging urgent treatment with development of a management plan; evaluation of patient's response of treatment; frequent reassessment; and discussions with other providers.  This critical care time was performed to assess and manage the high probability of imminent and life threatening deterioration that could result in multi-organ failure.  It was exclusive of separately billable procedures and treating other patients and teaching time.     DVT prophylaxis: IV heparin infusion   Code Status: Full   Family Communication: daughter at bedside   Disposition Plan: anticipate home    Consults called: cardiology  Admission status: INP   Level of care: Stepdown Standley Dakins MD [facility] How to contact the [facility] Attending or Consulting provider 7A - 7P or covering provider during after hours 7P -7A, for this patient?  Check the care team in [facility] and look for a) attending/consulting [facility] provider listed and b) the [facility] team listed Log into [email or web address] and use [place]'s universal password to access. If you do not have the password, please contact the hospital operator. Locate the [facility] provider you are looking for under [facility] and page to a number that you can be directly reached. If you still have difficulty reaching the provider, please page the [facility] (Director on Call) for the Hospitalists listed on amion for assistance.   If 7PM-7AM, please contact night-coverage [email or web address] Password [email or web address]  07/02/2023, 11:04 AM

## 2023-07-02 NOTE — ED Notes (Signed)
t alert, NAD, calm, interactive, resps e/u, speaking in clear complete sentences. Denies pain, sob, nausea or dizziness.

## 2023-07-03 ENCOUNTER — Inpatient Hospital Stay (HOSPITAL_COMMUNITY): Payer: Commercial Managed Care - PPO

## 2023-07-03 ENCOUNTER — Other Ambulatory Visit (HOSPITAL_COMMUNITY): Payer: Self-pay | Admitting: *Deleted

## 2023-07-03 ENCOUNTER — Other Ambulatory Visit (HOSPITAL_COMMUNITY): Payer: Self-pay

## 2023-07-03 DIAGNOSIS — R531 Weakness: Secondary | ICD-10-CM | POA: Diagnosis not present

## 2023-07-03 DIAGNOSIS — I4892 Unspecified atrial flutter: Secondary | ICD-10-CM | POA: Diagnosis not present

## 2023-07-03 LAB — BASIC METABOLIC PANEL
Anion gap: 11 (ref 5–15)
BUN: 15 mg/dL (ref 8–23)
CO2: 23 mmol/L (ref 22–32)
Calcium: 8.5 mg/dL — ABNORMAL LOW (ref 8.9–10.3)
Chloride: 95 mmol/L — ABNORMAL LOW (ref 98–111)
Creatinine, Ser: 0.54 mg/dL (ref 0.44–1.00)
GFR, Estimated: >60 mL/min (ref >60)
Glucose, Bld: 106 mg/dL — ABNORMAL HIGH (ref 70–99)
Potassium: 3 mmol/L — ABNORMAL LOW (ref 3.5–5.1)
Sodium: 129 mmol/L — ABNORMAL LOW (ref 135–145)

## 2023-07-03 LAB — ECHOCARDIOGRAM COMPLETE
AR max vel: 1.48 cm2
AV Area VTI: 1.66 cm2
AV Area mean vel: 1.4 cm2
AV Mean grad: 3 mm[Hg]
AV Peak grad: 6 mm[Hg]
Ao pk vel: 1.22 m/s
Area-P 1/2: 3.97 cm2
Height: 64 in
MV VTI: 2.68 cm2
S' Lateral: 2.7 cm
Weight: 1382.73 [oz_av]

## 2023-07-03 LAB — HIV ANTIBODY (ROUTINE TESTING W REFLEX): HIV Screen 4th Generation wRfx: NONREACTIVE

## 2023-07-03 LAB — CBC
HCT: 42.4 % (ref 36.0–46.0)
Hemoglobin: 14.7 g/dL (ref 12.0–15.0)
MCH: 30.1 pg (ref 26.0–34.0)
MCHC: 34.7 g/dL (ref 30.0–36.0)
MCV: 86.9 fL (ref 80.0–100.0)
Platelets: 198 10*3/uL (ref 150–400)
RBC: 4.88 MIL/uL (ref 3.87–5.11)
RDW: 12.5 % (ref 11.5–15.5)
WBC: 6.5 10*3/uL (ref 4.0–10.5)
nRBC: 0 % (ref 0.0–0.2)

## 2023-07-03 LAB — MAGNESIUM: Magnesium: 1.7 mg/dL (ref 1.7–2.4)

## 2023-07-03 LAB — HEPARIN LEVEL (UNFRACTIONATED): Heparin Unfractionated: 0.35 [IU]/mL (ref 0.30–0.70)

## 2023-07-03 LAB — PHOSPHORUS: Phosphorus: 3.7 mg/dL (ref 2.5–4.6)

## 2023-07-03 MED ORDER — APIXABAN 5 MG PO TABS
5.0000 mg | ORAL_TABLET | Freq: Two times a day (BID) | ORAL | Status: DC
  Administered 2023-07-03 – 2023-07-04 (×3): 5 mg via ORAL
  Filled 2023-07-03 (×3): qty 1

## 2023-07-03 MED ORDER — POTASSIUM CHLORIDE CRYS ER 20 MEQ PO TBCR
40.0000 meq | EXTENDED_RELEASE_TABLET | Freq: Once | ORAL | Status: AC
  Administered 2023-07-03: 40 meq via ORAL
  Filled 2023-07-03: qty 2

## 2023-07-03 MED ORDER — MAGNESIUM SULFATE 2 GM/50ML IV SOLN
2.0000 g | Freq: Once | INTRAVENOUS | Status: AC
  Administered 2023-07-03: 2 g via INTRAVENOUS
  Filled 2023-07-03: qty 50

## 2023-07-03 MED ORDER — POTASSIUM CHLORIDE 10 MEQ/100ML IV SOLN
10.0000 meq | INTRAVENOUS | Status: AC
  Administered 2023-07-03 (×4): 10 meq via INTRAVENOUS
  Filled 2023-07-03 (×4): qty 100

## 2023-07-03 MED ORDER — DILTIAZEM HCL 30 MG PO TABS
30.0000 mg | ORAL_TABLET | Freq: Two times a day (BID) | ORAL | Status: DC
  Administered 2023-07-03 – 2023-07-04 (×3): 30 mg via ORAL
  Filled 2023-07-03 (×4): qty 1

## 2023-07-03 NOTE — TOC Benefit Eligibility Note (Signed)
Patient Product/process development scientist completed.    The patient is insured through [facility]. Patient has [employer], may use a copay card, and/or apply for patient assistance if available.    Ran test claim for Eliquis 5 mg and the current 30 day co-pay is $110.24.   This test claim was processed through [facility]- copay amounts may vary at other pharmacies due to pharmacy/plan contracts, or as the patient moves through the different stages of their insurance plan.     Roland Earl, CPHT Pharmacy Technician III Certified Patient Advocate [facility] Pharmacy Patient Advocate Team Direct Number: [phone]  Fax: [phone]

## 2023-07-03 NOTE — Progress Notes (Signed)
PROGRESS NOTE   Sophia Mccoy  UXL:[id number] DOB: 12-06-1955 DOA: 07/02/2023 PCP: Carylon Perches, MD   Chief Complaint  Patient presents with   Shortness of Breath   Level of care: Stepdown  Brief Admission History:  67 y/o never smoker, with hypertension, hyperlipidemia, GERD, h/o of Covid infection in 2023, 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  This has been progressive over the past month.  Yesterday she noticed palpitations with findings of tachycardia into the 130s on her home pulse oximeter.  She reports that her heart rate is normally in the 40-50 range.  She normally takes diltiazem to 40 mg and has taken it consistently.  She denies having chest pain.  Her EKG demonstrated atrial flutter with RVR.  She was started on IV diltiazem bolus and infusion in the emergency department and this has gotten her heart rate under better control and now is in the 70 range but she remains in atrial flutter.  Due to her elevated CHA2DS2-VASc score she was started on IV heparin for full anticoagulation and cardiology was consulted.    Assessment and Plan:  Atrial Flutter with RVR  - rate is much better controlled on IV diltiazem infusion and now converted to sinus rhythm -CHA2DS2-VASc score of at least 3 and started on IV heparin infusion for full anticoagulation - TSH (WNL) - replacing Magnesium  - obtain TTE today - cardiology consultation appreciated - transfer to telemetry when off IV diltiazem    Essential hypertension  - holding home chlorthalidone in setting of electrolyte abnormalities - oral diltiazem started by cardiology team 30 mg BID    Hypokalemia - IV replacement ordered - recheck in AM   Hypomagnesemia - IV replacement ordered - recheck in AM    Hyperlipidemia - resumed home atorvastatin every evening  - Lipids are optimally  controlled at this time   GERD - pantoprazole ordered for GI protection   DVT prophylaxis: IV heparin--->apixaban Code Status: Full  Family Communication: daughter bedside 10/14-15 Disposition: Status is: Inpatient   Consultants:  Cardiology   Procedures:   Antimicrobials:    Subjective: Pt reports some SOB with ambulation.  No CP and no palpitations  Objective: Vitals:   07/03/23 0900 07/03/23 0930 07/03/23 1000 07/03/23 1030  BP: 120/69 138/69 (!) 156/69 106/84  Pulse: 82 85  73  Resp: 19 (!) 21  18  Temp:      TempSrc:      SpO2: 96% 94% 97% 95%  Weight:      Height:        Intake/Output Summary (Last 24 hours) at 07/03/2023 1040 Last data filed at 07/03/2023 1023 Gross per 24 hour  Intake 661.06 ml  Output --  Net 661.06 ml   Filed Weights   07/02/23 0634 07/02/23 1309 07/03/23 0500  Weight: 39 kg 40.7 kg 39.2 kg   Examination:  General exam: Appears calm and comfortable  Respiratory system: Clear to auscultation. Respiratory effort normal. Cardiovascular system: RRR, normal S1 & S2 heard. No JVD, murmurs, rubs, gallops or clicks. No pedal edema. Gastrointestinal system: Abdomen is nondistended, soft and nontender. No organomegaly or masses felt. Normal bowel sounds heard. Central nervous system: Alert and oriented. No focal neurological deficits. Extremities: Symmetric 5 x 5 power. Skin: No rashes, lesions or ulcers. Psychiatry: Judgement and insight appear normal. Mood & affect appropriate.   Data Reviewed: I have personally reviewed following  labs and imaging studies  CBC: Recent Labs  Lab 07/02/23 0640 07/02/23 0658 07/02/23 0756 07/03/23 0430  WBC 9.5  --   --  6.5  NEUTROABS 7.4  --   --   --   HGB 17.3* 16.3* 16.0* 14.7  HCT 50.5* 48.0* 47.0* 42.4  MCV 87.7  --   --  86.9  PLT 257  --   --  198    Basic Metabolic Panel: Recent Labs  Lab 07/02/23 0640 07/02/23 0658 07/02/23 0756 07/03/23 0430  NA 132* 134* 134* 129*  K 3.9 3.9  3.9 3.0*  CL 96* 100 100 95*  CO2 26  --   --  23  GLUCOSE 151* 139* 127* 106*  BUN 20 20 19 15   CREATININE 0.78 0.80 0.60 0.54  CALCIUM 9.1  --   --  8.5*  MG 1.5*  --   --  1.7  PHOS  --   --   --  3.7    CBG: No results for input(s): "GLUCAP" in the last 168 hours.  Recent Results (from the past 240 hour(s))  Resp panel by RT-PCR (RSV, Flu A&B, Covid) Anterior Nasal Swab     Status: None   Collection Time: 07/02/23  6:53 AM   Specimen: Anterior Nasal Swab  Result Value Ref Range Status   SARS Coronavirus 2 by RT PCR NEGATIVE NEGATIVE Final    Comment: (NOTE) SARS-CoV-2 target nucleic acids are NOT DETECTED.  The SARS-CoV-2 RNA is generally detectable in upper respiratory specimens during the acute phase of infection. The lowest concentration of SARS-CoV-2 viral copies this assay can detect is 138 copies/mL. A negative result does not preclude SARS-Cov-2 infection and should not be used as the sole basis for treatment or other patient management decisions. A negative result may occur with  improper specimen collection/handling, submission of specimen other than nasopharyngeal swab, presence of viral mutation(s) within the areas targeted by this assay, and inadequate number of viral copies(<138 copies/mL). A negative result must be combined with clinical observations, patient history, and epidemiological information. The expected result is Negative.  Fact Sheet for Patients:  BloggerCourse.com  Fact Sheet for Healthcare Providers:  SeriousBroker.it  This test is no t yet approved or cleared by the [place] FDA and  has been authorized for detection and/or diagnosis of SARS-CoV-2 by FDA under an Emergency Use Authorization (EUA). This EUA will remain  in effect (meaning this test can be used) for the duration of the COVID-19 declaration under Section 564(b)(1) of the Act, 21 U.S.C.section 360bbb-3(b)(1), unless the  authorization is terminated  or revoked sooner.       Influenza A by PCR NEGATIVE NEGATIVE Final   Influenza B by PCR NEGATIVE NEGATIVE Final    Comment: (NOTE) The Xpert Xpress SARS-CoV-2/FLU/RSV plus assay is intended as an aid in the diagnosis of influenza from Nasopharyngeal swab specimens and should not be used as a sole basis for treatment. Nasal washings and aspirates are unacceptable for Xpert Xpress SARS-CoV-2/FLU/RSV testing.  Fact Sheet for Patients: BloggerCourse.com  Fact Sheet for Healthcare Providers: SeriousBroker.it  This test is not yet approved or cleared by the [place] FDA and has been authorized for detection and/or diagnosis of SARS-CoV-2 by FDA under an Emergency Use Authorization (EUA). This EUA will remain in effect (meaning this test can be used) for the duration of the COVID-19 declaration under Section 564(b)(1) of the Act, 21 U.S.C. section 360bbb-3(b)(1), unless the authorization is terminated or revoked.  Resp Syncytial Virus by PCR NEGATIVE NEGATIVE Final    Comment: (NOTE) Fact Sheet for Patients: BloggerCourse.com  Fact Sheet for Healthcare Providers: SeriousBroker.it  This test is not yet approved or cleared by the [place] FDA and has been authorized for detection and/or diagnosis of SARS-CoV-2 by FDA under an Emergency Use Authorization (EUA). This EUA will remain in effect (meaning this test can be used) for the duration of the COVID-19 declaration under Section 564(b)(1) of the Act, 21 U.S.C. section 360bbb-3(b)(1), unless the authorization is terminated or revoked.  Performed at [facility], [address], [place], [place] [address]   MRSA Next Gen by PCR, Nasal     Status: None   Collection Time: 07/02/23  1:04 PM   Specimen: Nasal Mucosa; Nasal Swab  Result Value Ref Range Status   MRSA by PCR Next Gen NOT  DETECTED NOT DETECTED Final    Comment: (NOTE) The GeneXpert MRSA Assay (FDA approved for NASAL specimens only), is one component of a comprehensive MRSA colonization surveillance program. It is not intended to diagnose MRSA infection nor to guide or monitor treatment for MRSA infections. Test performance is not FDA approved in patients less than 85 years old. Performed at [facility], [address] [place], [place] [address]      Radiology Studies: CT Angio Chest PE W and/or Wo Contrast  Result Date: 07/02/2023 CLINICAL DATA:  Pulmonary embolism (PE) suspected, high prob. Shortness of breath. EXAM: CT ANGIOGRAPHY CHEST WITH CONTRAST TECHNIQUE: Multidetector CT imaging of the chest was performed using the standard protocol during bolus administration of intravenous contrast. Multiplanar CT image reconstructions and MIPs were obtained to evaluate the vascular anatomy. RADIATION DOSE REDUCTION: This exam was performed according to the departmental dose-optimization program which includes automated exposure control, adjustment of the mA and/or kV according to patient size and/or use of iterative reconstruction technique. CONTRAST:  75mL OMNIPAQUE IOHEXOL 350 MG/ML SOLN COMPARISON:  CT chest dated June 21, 2023 and May 19, 2022. FINDINGS: Cardiovascular: Satisfactory opacification of the pulmonary arteries to the segmental level. No evidence of pulmonary embolism. Normal heart size. No pericardial effusion. Multivessel coronary artery calcifications. Atherosclerotic calcification of the thoracic aorta and arch branch vessels. Mediastinum/Nodes: No enlarged mediastinal, hilar, or axillary lymph nodes. Thyroid gland, trachea, and esophagus demonstrate no significant findings. Lungs/Pleura: Upper lobe predominant emphysematous changes. Similar biapical pleural-parenchymal scarring. Linear parenchymal bands at the lung bases may represent atelectasis or scarring. Patchy tree-in-bud nodular opacities  within posterior right lower lobe appears similar to slightly more conspicuous compared to the prior exam dated June 21, 2023. No new or enlarging suspicious pulmonary nodule. No pleural effusion or pneumothorax. Upper Abdomen: No acute abnormality. Musculoskeletal: No acute osseous abnormality. Review of the MIP images confirms the above findings. IMPRESSION: 1. No evidence of pulmonary embolism. 2. Patchy tree-in-bud nodular opacities within the posterior right lower lobe may be secondary to a small airways infectious/inflammatory etiology such as bronchitis. 3. Aortic Atherosclerosis (ICD10-I70.0) and Emphysema (ICD10-J43.9). Electronically Signed   By: Hart Robinsons M.D.   On: 07/02/2023 10:12   DG Chest Port 1 View  Result Date: 07/02/2023 CLINICAL DATA:  67 year old female with history of dyspnea. EXAM: PORTABLE CHEST 1 VIEW COMPARISON:  Chest x-ray 05/19/2022. FINDINGS: Lung volumes are hyperexpanded with advanced emphysematous changes. Blunting of the costophrenic sulci bilaterally, similar to prior studies, likely reflective of chronic pleuroparenchymal scarring. Linear scarring is also noted in the lung apices bilaterally. No consolidative airspace disease. No pleural effusions. No pneumothorax.  No pulmonary nodule or mass noted. Pulmonary vasculature and the cardiomediastinal silhouette are within normal limits. Atherosclerosis in the thoracic aorta. IMPRESSION: 1. No radiographic evidence of acute cardiopulmonary disease. 2. Severe emphysema redemonstrated. 3. Aortic atherosclerosis. Electronically Signed   By: Trudie Reed M.D.   On: 07/02/2023 07:16    Scheduled Meds:  apixaban  5 mg Oral BID   atorvastatin  10 mg Oral QPM   Chlorhexidine Gluconate Cloth  6 each Topical Q0600   diltiazem  30 mg Oral BID   pantoprazole  40 mg Oral QHS   potassium chloride  40 mEq Oral Once   Continuous Infusions:  magnesium sulfate bolus IVPB     potassium chloride 100 mL/hr at 07/03/23 1023      LOS: 1 day   Time spent: 55 mins  Eryc Bodey Laural Benes, MD How to contact the [facility] Attending or Consulting provider 7A - 7P or covering provider during after hours 7P -7A, for this patient?  Check the care team in [facility] and look for a) attending/consulting TRH provider listed and b) the [facility] team listed Log into [email or web address] to find provider on call.  Locate the [facility] provider you are looking for under [facility] and page to a number that you can be directly reached. If you still have difficulty reaching the provider, please page the [facility] (Director on Call) for the Hospitalists listed on amion for assistance.  07/03/2023, 10:40 AM

## 2023-07-03 NOTE — Progress Notes (Signed)
PHARMACY - ANTICOAGULATION CONSULT NOTE  Pharmacy Consult for heparin Indication: atrial fibrillation  Allergies  Allergen Reactions   Codeine Nausea And Vomiting   Lisinopril Swelling    Patient Measurements: Height: 5\' 4"  (162.6 cm) Weight: 39.2 kg (86 lb 6.7 oz) IBW/kg (Calculated) : 54.7 Heparin Dosing Weight: 39 kg  Vital Signs: Temp: 97.5 F (36.4 C) (10/15 0751) Temp Source: Oral (10/15 0751) BP: 131/49 (10/15 0700) Pulse Rate: 76 (10/15 0751)  Labs: Recent Labs    07/02/23 0640 07/02/23 0658 07/02/23 0756 07/02/23 0843 07/02/23 1428 07/02/23 2207 07/03/23 0430  HGB 17.3* 16.3* 16.0*  --   --   --  14.7  HCT 50.5* 48.0* 47.0*  --   --   --  42.4  PLT 257  --   --   --   --   --  198  HEPARINUNFRC  --   --   --   --  0.18* 0.43 0.35  CREATININE 0.78 0.80 0.60  --   --   --  0.54  TROPONINIHS 8  --   --  8  --   --   --     Estimated Creatinine Clearance: 42.8 mL/min (by C-G formula based on SCr of 0.54 mg/dL).   Medical History: Past Medical History:  Diagnosis Date   Hypertension    Assessment: Pharmacy consulted to dose heparin in patient with atrial fibrillation.  Patient was not on anticoagulation prior to admission.  Heparin level 0.35 - therapeutic on heparin 700 units/hr. No bleeding or issues with the infusion per RN. CBC relatively stable overnight. No bleeding issues noted.   Goal of Therapy:  Heparin level 0.3-0.7 units/ml Monitor platelets by anticoagulation protocol: Yes   Plan:  Continue heparin infusion at 700 units/hr. Heparin level daily. Continue to monitor H&H and platelets.  Sheppard Coil PharmD., BCPS Clinical Pharmacist 07/03/2023 8:09 AM

## 2023-07-03 NOTE — Progress Notes (Signed)
*  PRELIMINARY RESULTS* Echocardiogram 2D Echocardiogram has been performed.  Carolyne Fiscal 07/03/2023, 10:29 AM

## 2023-07-03 NOTE — Progress Notes (Signed)
Transition of Care Department [facility]) has reviewed patient and no TOC needs have been identified at this time. We will continue to monitor patient advancement through interdisciplinary progression rounds. If new patient transition needs arise, please place a TOC consult.   07/03/23 0803  TOC Brief Assessment  Insurance and Status Reviewed  Patient has primary care physician Yes  Home environment has been reviewed Lives with spouse.  Prior level of function: Independent.  Prior/Current Home Services No current home services  Social Determinants of Health Reivew SDOH reviewed no interventions necessary  Readmission risk has been reviewed Yes  Transition of care needs no transition of care needs at this time

## 2023-07-03 NOTE — Progress Notes (Signed)
Rounding Note    Patient Name: Sophia Mccoy Date of Encounter: 07/03/2023  [facility] Cardiologist: Debbrah Alar Santez Woodcox  Subjective   Some ongoing SOB this morning.   Inpatient Medications    Scheduled Meds:  atorvastatin  10 mg Oral QPM   Chlorhexidine Gluconate Cloth  6 each Topical Q0600   pantoprazole  40 mg Oral QHS   Continuous Infusions:  diltiazem (CARDIZEM) infusion 2.5 mg/hr (07/03/23 0832)   heparin 700 Units/hr (07/03/23 0832)   magnesium sulfate bolus IVPB     potassium chloride 100 mL/hr at 07/03/23 0832   PRN Meds: acetaminophen **OR** acetaminophen, bisacodyl, fentaNYL (SUBLIMAZE) injection, ipratropium-albuterol, ondansetron **OR** ondansetron (ZOFRAN) IV, oxyCODONE, traZODone   Vital Signs    Vitals:   07/03/23 0500 07/03/23 0600 07/03/23 0700 07/03/23 0751  BP: 128/62 (!) 128/59 (!) 131/49   Pulse: (!) 57 69 (!) 56 76  Resp: 20 15 19 18   Temp:    (!) 97.5 F (36.4 C)  TempSrc:    Oral  SpO2: 95% 94% 94% 94%  Weight: 39.2 kg     Height:        Intake/Output Summary (Last 24 hours) at 07/03/2023 0836 Last data filed at 07/03/2023 8119 Gross per 24 hour  Intake 413.73 ml  Output --  Net 413.73 ml      07/03/2023    5:00 AM 07/02/2023    1:09 PM 07/02/2023    6:34 AM  Last 3 Weights  Weight (lbs) 86 lb 6.7 oz 89 lb 11.6 oz 86 lb  Weight (kg) 39.2 kg 40.7 kg 39.009 kg      Telemetry    Converted from aflutter to NSR around midnight - Personally Reviewed  ECG    NSR - Personally Reviewed  Physical Exam   GEN: No acute distress.   Neck: No JVD Cardiac: RRR, no murmurs, rubs, or gallops.  Respiratory: Clear to auscultation bilaterally. GI: Soft, nontender, non-distended  MS: No edema; No deformity. Neuro:  Nonfocal  Psych: Normal affect   Labs    High Sensitivity Troponin:   Recent Labs  Lab 07/02/23 0640 07/02/23 0843  TROPONINIHS 8 8     Chemistry Recent Labs  Lab 07/02/23 0640 07/02/23 0658  07/02/23 0756 07/03/23 0430  NA 132* 134* 134* 129*  K 3.9 3.9 3.9 3.0*  CL 96* 100 100 95*  CO2 26  --   --  23  GLUCOSE 151* 139* 127* 106*  BUN 20 20 19 15   CREATININE 0.78 0.80 0.60 0.54  CALCIUM 9.1  --   --  8.5*  MG 1.5*  --   --  1.7  PROT 6.8  --   --   --   ALBUMIN 3.9  --   --   --   AST 19  --   --   --   ALT 21  --   --   --   ALKPHOS 54  --   --   --   BILITOT 0.8  --   --   --   GFRNONAA >60  --   --  >60  ANIONGAP 10  --   --  11    Lipids No results for input(s): "CHOL", "TRIG", "HDL", "LABVLDL", "LDLCALC", "CHOLHDL" in the last 168 hours.  Hematology Recent Labs  Lab 07/02/23 0640 07/02/23 0658 07/02/23 0756 07/03/23 0430  WBC 9.5  --   --  6.5  RBC 5.76*  --   --  4.88  HGB 17.3* 16.3* 16.0* 14.7  HCT 50.5* 48.0* 47.0* 42.4  MCV 87.7  --   --  86.9  MCH 30.0  --   --  30.1  MCHC 34.3  --   --  34.7  RDW 12.7  --   --  12.5  PLT 257  --   --  198   Thyroid  Recent Labs  Lab 07/02/23 0843  TSH 2.045    BNP Recent Labs  Lab 07/02/23 0640  BNP 196.0*    DDimer No results for input(s): "DDIMER" in the last 168 hours.   Radiology    CT Angio Chest PE W and/or Wo Contrast  Result Date: 07/02/2023 CLINICAL DATA:  Pulmonary embolism (PE) suspected, high prob. Shortness of breath. EXAM: CT ANGIOGRAPHY CHEST WITH CONTRAST TECHNIQUE: Multidetector CT imaging of the chest was performed using the standard protocol during bolus administration of intravenous contrast. Multiplanar CT image reconstructions and MIPs were obtained to evaluate the vascular anatomy. RADIATION DOSE REDUCTION: This exam was performed according to the departmental dose-optimization program which includes automated exposure control, adjustment of the mA and/or kV according to patient size and/or use of iterative reconstruction technique. CONTRAST:  75mL OMNIPAQUE IOHEXOL 350 MG/ML SOLN COMPARISON:  CT chest dated June 21, 2023 and May 19, 2022. FINDINGS: Cardiovascular:  Satisfactory opacification of the pulmonary arteries to the segmental level. No evidence of pulmonary embolism. Normal heart size. No pericardial effusion. Multivessel coronary artery calcifications. Atherosclerotic calcification of the thoracic aorta and arch Sophia Mccoy vessels. Mediastinum/Nodes: No enlarged mediastinal, hilar, or axillary lymph nodes. Thyroid gland, trachea, and esophagus demonstrate no significant findings. Lungs/Pleura: Upper lobe predominant emphysematous changes. Similar biapical pleural-parenchymal scarring. Linear parenchymal bands at the lung bases may represent atelectasis or scarring. Patchy tree-in-bud nodular opacities within posterior right lower lobe appears similar to slightly more conspicuous compared to the prior exam dated June 21, 2023. No new or enlarging suspicious pulmonary nodule. No pleural effusion or pneumothorax. Upper Abdomen: No acute abnormality. Musculoskeletal: No acute osseous abnormality. Review of the MIP images confirms the above findings. IMPRESSION: 1. No evidence of pulmonary embolism. 2. Patchy tree-in-bud nodular opacities within the posterior right lower lobe may be secondary to a small airways infectious/inflammatory etiology such as bronchitis. 3. Aortic Atherosclerosis (ICD10-I70.0) and Emphysema (ICD10-J43.9). Electronically Signed   By: Hart Robinsons M.D.   On: 07/02/2023 10:12   DG Chest Port 1 View  Result Date: 07/02/2023 CLINICAL DATA:  67 year old female with history of dyspnea. EXAM: PORTABLE CHEST 1 VIEW COMPARISON:  Chest x-ray 05/19/2022. FINDINGS: Lung volumes are hyperexpanded with advanced emphysematous changes. Blunting of the costophrenic sulci bilaterally, similar to prior studies, likely reflective of chronic pleuroparenchymal scarring. Linear scarring is also noted in the lung apices bilaterally. No consolidative airspace disease. No pleural effusions. No pneumothorax. No pulmonary nodule or mass noted. Pulmonary vasculature and  the cardiomediastinal silhouette are within normal limits. Atherosclerosis in the thoracic aorta. IMPRESSION: 1. No radiographic evidence of acute cardiopulmonary disease. 2. Severe emphysema redemonstrated. 3. Aortic atherosclerosis. Electronically Signed   By: Trudie Reed M.D.   On: 07/02/2023 07:16    Cardiac Studies    Patient Profile     Sophia Mccoy is a 68 y.o. female with a hx of HTN, HLD, GERD who is being seen 07/02/2023 for the evaluation of Aflutter with RVR at the request of Dr. Laural Benes.   Assessment & Plan    1.New onset aflutter, RVR - new diagnosis of aflutter this  admission - started on dilt gtt, she converted to sinus rhythm last night around midnight. REmains in SR this AM.  - start oral dilt 30mg  bid.  - CHADS2Vasc score is at least 3 (gender, age, HTN), startd on hep gtt. Will d/c and start eliquis 5mg  bid today.  - f/u echo results   -likely discharge later today once echo complete  2.DOE - some ongoing symptoms even with being back in normal rhtyhm today - f/u echo results.    Soft bp's, some ongoing SOB/DOE. Would monitor at least one additional day see how she tolerates her oral regimen, see how breathing does and if any alternative etiologies need to be evaluated.   For questions or updates, please contact [facility] Please consult [email or web address] for contact info under        Signed, Dina Rich, MD  07/03/2023, 8:36 AM

## 2023-07-04 ENCOUNTER — Other Ambulatory Visit: Payer: Self-pay

## 2023-07-04 DIAGNOSIS — I4892 Unspecified atrial flutter: Secondary | ICD-10-CM | POA: Diagnosis not present

## 2023-07-04 DIAGNOSIS — J449 Chronic obstructive pulmonary disease, unspecified: Secondary | ICD-10-CM | POA: Diagnosis present

## 2023-07-04 LAB — BASIC METABOLIC PANEL
Anion gap: 8 (ref 5–15)
BUN: 21 mg/dL (ref 8–23)
CO2: 25 mmol/L (ref 22–32)
Calcium: 8.5 mg/dL — ABNORMAL LOW (ref 8.9–10.3)
Chloride: 98 mmol/L (ref 98–111)
Creatinine, Ser: 0.54 mg/dL (ref 0.44–1.00)
GFR, Estimated: >60 mL/min (ref >60)
Glucose, Bld: 97 mg/dL (ref 70–99)
Potassium: 3.9 mmol/L (ref 3.5–5.1)
Sodium: 131 mmol/L — ABNORMAL LOW (ref 135–145)

## 2023-07-04 LAB — CBC
HCT: 40.7 % (ref 36.0–46.0)
Hemoglobin: 14 g/dL (ref 12.0–15.0)
MCH: 30.2 pg (ref 26.0–34.0)
MCHC: 34.4 g/dL (ref 30.0–36.0)
MCV: 87.9 fL (ref 80.0–100.0)
Platelets: 163 10*3/uL (ref 150–400)
RBC: 4.63 MIL/uL (ref 3.87–5.11)
RDW: 12.5 % (ref 11.5–15.5)
WBC: 5.4 10*3/uL (ref 4.0–10.5)
nRBC: 0 % (ref 0.0–0.2)

## 2023-07-04 LAB — MAGNESIUM: Magnesium: 1.7 mg/dL (ref 1.7–2.4)

## 2023-07-04 MED ORDER — BUDESONIDE-FORMOTEROL FUMARATE 160-4.5 MCG/ACT IN AERO
2.0000 | INHALATION_SPRAY | Freq: Two times a day (BID) | RESPIRATORY_TRACT | 12 refills | Status: DC
Start: 2023-07-04 — End: 2023-07-04

## 2023-07-04 MED ORDER — DILTIAZEM HCL 30 MG PO TABS
30.0000 mg | ORAL_TABLET | Freq: Two times a day (BID) | ORAL | 5 refills | Status: DC
Start: 2023-07-04 — End: 2023-07-30
  Filled 2023-07-04: qty 60, 30d supply, fill #0

## 2023-07-04 MED ORDER — ALBUTEROL SULFATE HFA 108 (90 BASE) MCG/ACT IN AERS: 2.0000 | INHALATION_SPRAY | Freq: Four times a day (QID) | RESPIRATORY_TRACT | 2 refills | Status: DC | PRN

## 2023-07-04 MED ORDER — DILTIAZEM HCL 30 MG PO TABS: 30.0000 mg | ORAL_TABLET | Freq: Two times a day (BID) | ORAL | 5 refills | Status: DC

## 2023-07-04 MED ORDER — ALBUTEROL SULFATE (2.5 MG/3ML) 0.083% IN NEBU: 2.5000 mg | INHALATION_SOLUTION | RESPIRATORY_TRACT | 2 refills | Status: DC | PRN

## 2023-07-04 MED ORDER — APIXABAN 5 MG PO TABS
5.0000 mg | ORAL_TABLET | Freq: Two times a day (BID) | ORAL | 5 refills | Status: DC
  Filled 2023-07-04 – 2023-07-06 (×2): qty 60, 30d supply, fill #0
  Filled 2023-08-04: qty 60, 30d supply, fill #1
  Filled 2023-09-03: qty 60, 30d supply, fill #2
  Filled 2023-10-03: qty 60, 30d supply, fill #3
  Filled 2023-10-30: qty 60, 30d supply, fill #4
  Filled 2023-11-28: qty 60, 30d supply, fill #5

## 2023-07-04 MED ORDER — BUDESONIDE-FORMOTEROL FUMARATE 160-4.5 MCG/ACT IN AERO
2.0000 | INHALATION_SPRAY | Freq: Two times a day (BID) | RESPIRATORY_TRACT | 12 refills | Status: DC
Start: 2023-07-04 — End: 2024-02-18
  Filled 2023-07-04: qty 10.2, 30d supply, fill #0
  Filled 2023-07-30: qty 10.2, 30d supply, fill #1
  Filled 2023-08-29: qty 10.2, 30d supply, fill #2
  Filled 2023-09-28: qty 10.2, 30d supply, fill #3
  Filled 2023-10-23: qty 10.2, 30d supply, fill #4
  Filled 2023-11-22: qty 10.2, 30d supply, fill #5
  Filled 2023-12-22: qty 10.2, 30d supply, fill #6
  Filled 2024-01-18: qty 10.2, 30d supply, fill #7

## 2023-07-04 MED ORDER — ATORVASTATIN CALCIUM 10 MG PO TABS
10.0000 mg | ORAL_TABLET | Freq: Every day | ORAL | 4 refills | Status: DC
  Filled 2023-07-04 – 2023-07-25 (×2): qty 90, 90d supply, fill #0
  Filled 2023-10-23: qty 90, 90d supply, fill #1
  Filled 2024-01-20: qty 90, 90d supply, fill #2

## 2023-07-04 MED ORDER — ALBUTEROL SULFATE (2.5 MG/3ML) 0.083% IN NEBU
2.5000 mg | INHALATION_SOLUTION | RESPIRATORY_TRACT | 2 refills | Status: DC | PRN
  Filled 2023-07-04: qty 75, 5d supply, fill #0

## 2023-07-04 MED ORDER — TRAZODONE HCL 50 MG PO TABS: 50.0000 mg | ORAL_TABLET | Freq: Every evening | ORAL | 2 refills | Status: DC | PRN

## 2023-07-04 MED ORDER — TRAZODONE HCL 50 MG PO TABS
50.0000 mg | ORAL_TABLET | Freq: Every evening | ORAL | 2 refills | Status: DC | PRN
  Filled 2023-07-04: qty 30, 30d supply, fill #0

## 2023-07-04 MED ORDER — ALBUTEROL SULFATE HFA 108 (90 BASE) MCG/ACT IN AERS
2.0000 | INHALATION_SPRAY | Freq: Four times a day (QID) | RESPIRATORY_TRACT | 2 refills | Status: DC | PRN
  Filled 2023-07-04: qty 13.4, 50d supply, fill #0

## 2023-07-04 MED ORDER — APIXABAN 5 MG PO TABS: 5.0000 mg | ORAL_TABLET | Freq: Two times a day (BID) | ORAL | 5 refills | Status: DC

## 2023-07-04 NOTE — Plan of Care (Signed)
  Problem: Clinical Measurements: Goal: Ability to maintain clinical measurements within normal limits will improve Outcome: Progressing   Problem: Activity: Goal: Risk for activity intolerance will decrease Outcome: Progressing   Problem: Education: Goal: Knowledge of disease or condition will improve Outcome: Progressing

## 2023-07-04 NOTE — Progress Notes (Signed)
Discharge instructions given. Oxygen and equipment delivered. Patient discharged home to family's care.

## 2023-07-04 NOTE — Discharge Summary (Signed)
Sophia Mccoy, is a 67 y.o. female  DOB September 11, 1956  MRN [id number].  Admission date:  07/02/2023  Admitting Physician  Cleora Fleet, MD  Discharge Date:  07/04/2023   Primary MD  Carylon Perches, MD  Recommendations for primary care physician for things to follow:   1)You are taking Apixaban/Eliquis which is a Blood Thinner----Avoid ibuprofen/Advil/Aleve/Motrin/Goody Powders/Naproxen/BC powders/Meloxicam/Diclofenac/Indomethacin and other Nonsteroidal anti-inflammatory medications as these will make you more likely to bleed and can cause stomach ulcers, can also cause Kidney problems.   2)follow up with cardiologist Dr. Wyline Mood for recheck and reevaluation's as advised  3) you will need referral to pulmonology for outpatient evaluation and pulmonary function test/PFT test in the near future  4)you need oxygen at home at 2 L via nasal cannula continuously while awake and while asleep--- smoking or having open fires around oxygen can cause fire, significant injury and death  5) please note that there has been several changes to your medications  Admission Diagnosis  Hypomagnesemia [E83.42] SOB (shortness of breath) [R06.02] Atrial flutter with rapid ventricular response (HCC) [I48.92] Generalized weakness [R53.1]   Discharge Diagnosis  Hypomagnesemia [E83.42] SOB (shortness of breath) [R06.02] Atrial flutter with rapid ventricular response (HCC) [I48.92] Generalized weakness [R53.1]    Principal Problem:   Atrial flutter with rapid ventricular response (HCC) Active Problems:   COPD (chronic obstructive pulmonary disease) (HCC)   Essential hypertension   Hyperlipidemia   GERD (gastroesophageal reflux disease)   Hypomagnesemia   Generalized weakness      Past Medical History:  Diagnosis Date   Hypertension     Past Surgical History:  Procedure Laterality Date   ABDOMINAL HYSTERECTOMY      TEMPOROMANDIBULAR JOINT SURGERY     TONSILLECTOMY         HPI  from the history and physical done on the day of admission:    Chief Complaint: weakness and palpitations   HPI: Sophia Mccoy is a 67 y/o never smoker, with hypertension, hyperlipidemia, GERD, h/o of Covid infection in 2023, 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  This has been progressive over the past month.  Yesterday she noticed palpitations with findings of tachycardia into the 130s on her home pulse oximeter.  She reports that her heart rate is normally in the 40-50 range.  She normally takes diltiazem to 40 mg and has taken it consistently.  She denies having chest pain.  Her EKG demonstrated atrial flutter with RVR.  She was started on IV diltiazem bolus and infusion in the emergency department and this has gotten her heart rate under better control and now is in the 70 range but she remains in atrial flutter.  Due to her elevated CHA2DS2-VASc score she was started on IV heparin for full anticoagulation and cardiology was consulted.    Hospital Course:   1) Atrial Flutter with RVR  - rate is much better controlled on IV diltiazem infusion  every 6 (six) hours as needed for wheezing or shortness of breath.   apixaban 5 MG Tabs tablet Commonly known as: ELIQUIS Take 1 tablet (5 mg total) by mouth 2 (two) times daily.   atorvastatin 10 MG tablet Commonly known as: LIPITOR Take 1 tablet (10 mg total) by mouth daily for high cholesterol.   budesonide-formoterol 160-4.5 MCG/ACT inhaler Commonly known as: Symbicort Inhale 2 puffs into the lungs 2 (two) times daily.   cholecalciferol 1000 units tablet Commonly known as: VITAMIN D Take 1,000 Units by mouth daily.   diltiazem 30 MG tablet Commonly known as: Cardizem Take 1 tablet (30 mg total) by mouth 2 (two) times daily.   traZODone 50 MG tablet Commonly known as: DESYREL Take 1 tablet (50 mg total) by mouth at bedtime as needed for sleep.   vitamin C 1000 MG tablet Take 1,000 mg by mouth daily.               Durable Medical Equipment  (From admission, onward)           Start     Ordered   07/04/23 1031  For home use only DME Nebulizer machine  Once       Question Answer Comment  Patient needs a nebulizer to treat  with the following condition COPD (chronic obstructive pulmonary disease) (HCC)   Length of Need Lifetime      07/04/23 1030   07/04/23 1031  For home use only DME Nebulizer/meds  Once       Question Answer Comment  Patient needs a nebulizer to treat with the following condition COPD (chronic obstructive pulmonary disease) (HCC)   Length of Need Lifetime      07/04/23 1030   07/04/23 1026  For home use only DME oxygen  Once       Comments: SATURATION QUALIFICATIONS: (This note is used to comply with regulatory documentation for home oxygen)   Patient Saturations on Room Air at Rest = 88 %   Patient Saturations on Room Air while Ambulating = 85 %   Patient Saturations on 2 Liters of oxygen while Ambulating = 93 %      Patient needs continuous O2 at 2 L/min continuously via nasal cannula with humidifier, with gaseous portability and conserving device    Dx--COPD  Question Answer Comment  Length of Need Lifetime   Mode or (Route) Nasal cannula   Liters per Minute 2   Frequency Continuous (stationary and portable oxygen unit needed)   Oxygen conserving device Yes   Oxygen delivery system Gas      07/04/23 1025            Major procedures and Radiology Reports - PLEASE review detailed and final reports for all details, in brief -   ECHOCARDIOGRAM COMPLETE  Result Date: 07/03/2023    ECHOCARDIOGRAM REPORT   Patient Name:   Sophia Mccoy [facility] Date of Exam: 07/03/2023 Medical Rec #:  [id number]      Height:       64.0 in Accession #:    [id number]     Weight:       86.4 lb Date of Birth:  05-26-1956     BSA:          1.370 m Patient Age:    66 years       BP:           120/69 mmHg Patient Gender: F  every 6 (six) hours as needed for wheezing or shortness of breath.   apixaban 5 MG Tabs tablet Commonly known as: ELIQUIS Take 1 tablet (5 mg total) by mouth 2 (two) times daily.   atorvastatin 10 MG tablet Commonly known as: LIPITOR Take 1 tablet (10 mg total) by mouth daily for high cholesterol.   budesonide-formoterol 160-4.5 MCG/ACT inhaler Commonly known as: Symbicort Inhale 2 puffs into the lungs 2 (two) times daily.   cholecalciferol 1000 units tablet Commonly known as: VITAMIN D Take 1,000 Units by mouth daily.   diltiazem 30 MG tablet Commonly known as: Cardizem Take 1 tablet (30 mg total) by mouth 2 (two) times daily.   traZODone 50 MG tablet Commonly known as: DESYREL Take 1 tablet (50 mg total) by mouth at bedtime as needed for sleep.   vitamin C 1000 MG tablet Take 1,000 mg by mouth daily.               Durable Medical Equipment  (From admission, onward)           Start     Ordered   07/04/23 1031  For home use only DME Nebulizer machine  Once       Question Answer Comment  Patient needs a nebulizer to treat  with the following condition COPD (chronic obstructive pulmonary disease) (HCC)   Length of Need Lifetime      07/04/23 1030   07/04/23 1031  For home use only DME Nebulizer/meds  Once       Question Answer Comment  Patient needs a nebulizer to treat with the following condition COPD (chronic obstructive pulmonary disease) (HCC)   Length of Need Lifetime      07/04/23 1030   07/04/23 1026  For home use only DME oxygen  Once       Comments: SATURATION QUALIFICATIONS: (This note is used to comply with regulatory documentation for home oxygen)   Patient Saturations on Room Air at Rest = 88 %   Patient Saturations on Room Air while Ambulating = 85 %   Patient Saturations on 2 Liters of oxygen while Ambulating = 93 %      Patient needs continuous O2 at 2 L/min continuously via nasal cannula with humidifier, with gaseous portability and conserving device    Dx--COPD  Question Answer Comment  Length of Need Lifetime   Mode or (Route) Nasal cannula   Liters per Minute 2   Frequency Continuous (stationary and portable oxygen unit needed)   Oxygen conserving device Yes   Oxygen delivery system Gas      07/04/23 1025            Major procedures and Radiology Reports - PLEASE review detailed and final reports for all details, in brief -   ECHOCARDIOGRAM COMPLETE  Result Date: 07/03/2023    ECHOCARDIOGRAM REPORT   Patient Name:   Sophia Mccoy [facility] Date of Exam: 07/03/2023 Medical Rec #:  [id number]      Height:       64.0 in Accession #:    [id number]     Weight:       86.4 lb Date of Birth:  05-26-1956     BSA:          1.370 m Patient Age:    66 years       BP:           120/69 mmHg Patient Gender: F  Sophia Mccoy, is a 67 y.o. female  DOB September 11, 1956  MRN [id number].  Admission date:  07/02/2023  Admitting Physician  Cleora Fleet, MD  Discharge Date:  07/04/2023   Primary MD  Carylon Perches, MD  Recommendations for primary care physician for things to follow:   1)You are taking Apixaban/Eliquis which is a Blood Thinner----Avoid ibuprofen/Advil/Aleve/Motrin/Goody Powders/Naproxen/BC powders/Meloxicam/Diclofenac/Indomethacin and other Nonsteroidal anti-inflammatory medications as these will make you more likely to bleed and can cause stomach ulcers, can also cause Kidney problems.   2)follow up with cardiologist Dr. Wyline Mood for recheck and reevaluation's as advised  3) you will need referral to pulmonology for outpatient evaluation and pulmonary function test/PFT test in the near future  4)you need oxygen at home at 2 L via nasal cannula continuously while awake and while asleep--- smoking or having open fires around oxygen can cause fire, significant injury and death  5) please note that there has been several changes to your medications  Admission Diagnosis  Hypomagnesemia [E83.42] SOB (shortness of breath) [R06.02] Atrial flutter with rapid ventricular response (HCC) [I48.92] Generalized weakness [R53.1]   Discharge Diagnosis  Hypomagnesemia [E83.42] SOB (shortness of breath) [R06.02] Atrial flutter with rapid ventricular response (HCC) [I48.92] Generalized weakness [R53.1]    Principal Problem:   Atrial flutter with rapid ventricular response (HCC) Active Problems:   COPD (chronic obstructive pulmonary disease) (HCC)   Essential hypertension   Hyperlipidemia   GERD (gastroesophageal reflux disease)   Hypomagnesemia   Generalized weakness      Past Medical History:  Diagnosis Date   Hypertension     Past Surgical History:  Procedure Laterality Date   ABDOMINAL HYSTERECTOMY      TEMPOROMANDIBULAR JOINT SURGERY     TONSILLECTOMY         HPI  from the history and physical done on the day of admission:    Chief Complaint: weakness and palpitations   HPI: Sophia Mccoy is a 67 y/o never smoker, with hypertension, hyperlipidemia, GERD, h/o of Covid infection in 2023, 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  This has been progressive over the past month.  Yesterday she noticed palpitations with findings of tachycardia into the 130s on her home pulse oximeter.  She reports that her heart rate is normally in the 40-50 range.  She normally takes diltiazem to 40 mg and has taken it consistently.  She denies having chest pain.  Her EKG demonstrated atrial flutter with RVR.  She was started on IV diltiazem bolus and infusion in the emergency department and this has gotten her heart rate under better control and now is in the 70 range but she remains in atrial flutter.  Due to her elevated CHA2DS2-VASc score she was started on IV heparin for full anticoagulation and cardiology was consulted.    Hospital Course:   1) Atrial Flutter with RVR  - rate is much better controlled on IV diltiazem infusion  Sophia Mccoy, is a 67 y.o. female  DOB September 11, 1956  MRN [id number].  Admission date:  07/02/2023  Admitting Physician  Cleora Fleet, MD  Discharge Date:  07/04/2023   Primary MD  Carylon Perches, MD  Recommendations for primary care physician for things to follow:   1)You are taking Apixaban/Eliquis which is a Blood Thinner----Avoid ibuprofen/Advil/Aleve/Motrin/Goody Powders/Naproxen/BC powders/Meloxicam/Diclofenac/Indomethacin and other Nonsteroidal anti-inflammatory medications as these will make you more likely to bleed and can cause stomach ulcers, can also cause Kidney problems.   2)follow up with cardiologist Dr. Wyline Mood for recheck and reevaluation's as advised  3) you will need referral to pulmonology for outpatient evaluation and pulmonary function test/PFT test in the near future  4)you need oxygen at home at 2 L via nasal cannula continuously while awake and while asleep--- smoking or having open fires around oxygen can cause fire, significant injury and death  5) please note that there has been several changes to your medications  Admission Diagnosis  Hypomagnesemia [E83.42] SOB (shortness of breath) [R06.02] Atrial flutter with rapid ventricular response (HCC) [I48.92] Generalized weakness [R53.1]   Discharge Diagnosis  Hypomagnesemia [E83.42] SOB (shortness of breath) [R06.02] Atrial flutter with rapid ventricular response (HCC) [I48.92] Generalized weakness [R53.1]    Principal Problem:   Atrial flutter with rapid ventricular response (HCC) Active Problems:   COPD (chronic obstructive pulmonary disease) (HCC)   Essential hypertension   Hyperlipidemia   GERD (gastroesophageal reflux disease)   Hypomagnesemia   Generalized weakness      Past Medical History:  Diagnosis Date   Hypertension     Past Surgical History:  Procedure Laterality Date   ABDOMINAL HYSTERECTOMY      TEMPOROMANDIBULAR JOINT SURGERY     TONSILLECTOMY         HPI  from the history and physical done on the day of admission:    Chief Complaint: weakness and palpitations   HPI: Sophia Mccoy is a 67 y/o never smoker, with hypertension, hyperlipidemia, GERD, h/o of Covid infection in 2023, 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  This has been progressive over the past month.  Yesterday she noticed palpitations with findings of tachycardia into the 130s on her home pulse oximeter.  She reports that her heart rate is normally in the 40-50 range.  She normally takes diltiazem to 40 mg and has taken it consistently.  She denies having chest pain.  Her EKG demonstrated atrial flutter with RVR.  She was started on IV diltiazem bolus and infusion in the emergency department and this has gotten her heart rate under better control and now is in the 70 range but she remains in atrial flutter.  Due to her elevated CHA2DS2-VASc score she was started on IV heparin for full anticoagulation and cardiology was consulted.    Hospital Course:   1) Atrial Flutter with RVR  - rate is much better controlled on IV diltiazem infusion  every 6 (six) hours as needed for wheezing or shortness of breath.   apixaban 5 MG Tabs tablet Commonly known as: ELIQUIS Take 1 tablet (5 mg total) by mouth 2 (two) times daily.   atorvastatin 10 MG tablet Commonly known as: LIPITOR Take 1 tablet (10 mg total) by mouth daily for high cholesterol.   budesonide-formoterol 160-4.5 MCG/ACT inhaler Commonly known as: Symbicort Inhale 2 puffs into the lungs 2 (two) times daily.   cholecalciferol 1000 units tablet Commonly known as: VITAMIN D Take 1,000 Units by mouth daily.   diltiazem 30 MG tablet Commonly known as: Cardizem Take 1 tablet (30 mg total) by mouth 2 (two) times daily.   traZODone 50 MG tablet Commonly known as: DESYREL Take 1 tablet (50 mg total) by mouth at bedtime as needed for sleep.   vitamin C 1000 MG tablet Take 1,000 mg by mouth daily.               Durable Medical Equipment  (From admission, onward)           Start     Ordered   07/04/23 1031  For home use only DME Nebulizer machine  Once       Question Answer Comment  Patient needs a nebulizer to treat  with the following condition COPD (chronic obstructive pulmonary disease) (HCC)   Length of Need Lifetime      07/04/23 1030   07/04/23 1031  For home use only DME Nebulizer/meds  Once       Question Answer Comment  Patient needs a nebulizer to treat with the following condition COPD (chronic obstructive pulmonary disease) (HCC)   Length of Need Lifetime      07/04/23 1030   07/04/23 1026  For home use only DME oxygen  Once       Comments: SATURATION QUALIFICATIONS: (This note is used to comply with regulatory documentation for home oxygen)   Patient Saturations on Room Air at Rest = 88 %   Patient Saturations on Room Air while Ambulating = 85 %   Patient Saturations on 2 Liters of oxygen while Ambulating = 93 %      Patient needs continuous O2 at 2 L/min continuously via nasal cannula with humidifier, with gaseous portability and conserving device    Dx--COPD  Question Answer Comment  Length of Need Lifetime   Mode or (Route) Nasal cannula   Liters per Minute 2   Frequency Continuous (stationary and portable oxygen unit needed)   Oxygen conserving device Yes   Oxygen delivery system Gas      07/04/23 1025            Major procedures and Radiology Reports - PLEASE review detailed and final reports for all details, in brief -   ECHOCARDIOGRAM COMPLETE  Result Date: 07/03/2023    ECHOCARDIOGRAM REPORT   Patient Name:   Sophia Mccoy [facility] Date of Exam: 07/03/2023 Medical Rec #:  [id number]      Height:       64.0 in Accession #:    [id number]     Weight:       86.4 lb Date of Birth:  05-26-1956     BSA:          1.370 m Patient Age:    66 years       BP:           120/69 mmHg Patient Gender: F  Sophia Mccoy, is a 67 y.o. female  DOB September 11, 1956  MRN [id number].  Admission date:  07/02/2023  Admitting Physician  Cleora Fleet, MD  Discharge Date:  07/04/2023   Primary MD  Carylon Perches, MD  Recommendations for primary care physician for things to follow:   1)You are taking Apixaban/Eliquis which is a Blood Thinner----Avoid ibuprofen/Advil/Aleve/Motrin/Goody Powders/Naproxen/BC powders/Meloxicam/Diclofenac/Indomethacin and other Nonsteroidal anti-inflammatory medications as these will make you more likely to bleed and can cause stomach ulcers, can also cause Kidney problems.   2)follow up with cardiologist Dr. Wyline Mood for recheck and reevaluation's as advised  3) you will need referral to pulmonology for outpatient evaluation and pulmonary function test/PFT test in the near future  4)you need oxygen at home at 2 L via nasal cannula continuously while awake and while asleep--- smoking or having open fires around oxygen can cause fire, significant injury and death  5) please note that there has been several changes to your medications  Admission Diagnosis  Hypomagnesemia [E83.42] SOB (shortness of breath) [R06.02] Atrial flutter with rapid ventricular response (HCC) [I48.92] Generalized weakness [R53.1]   Discharge Diagnosis  Hypomagnesemia [E83.42] SOB (shortness of breath) [R06.02] Atrial flutter with rapid ventricular response (HCC) [I48.92] Generalized weakness [R53.1]    Principal Problem:   Atrial flutter with rapid ventricular response (HCC) Active Problems:   COPD (chronic obstructive pulmonary disease) (HCC)   Essential hypertension   Hyperlipidemia   GERD (gastroesophageal reflux disease)   Hypomagnesemia   Generalized weakness      Past Medical History:  Diagnosis Date   Hypertension     Past Surgical History:  Procedure Laterality Date   ABDOMINAL HYSTERECTOMY      TEMPOROMANDIBULAR JOINT SURGERY     TONSILLECTOMY         HPI  from the history and physical done on the day of admission:    Chief Complaint: weakness and palpitations   HPI: Sophia Mccoy is a 67 y/o never smoker, with hypertension, hyperlipidemia, GERD, h/o of Covid infection in 2023, 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  This has been progressive over the past month.  Yesterday she noticed palpitations with findings of tachycardia into the 130s on her home pulse oximeter.  She reports that her heart rate is normally in the 40-50 range.  She normally takes diltiazem to 40 mg and has taken it consistently.  She denies having chest pain.  Her EKG demonstrated atrial flutter with RVR.  She was started on IV diltiazem bolus and infusion in the emergency department and this has gotten her heart rate under better control and now is in the 70 range but she remains in atrial flutter.  Due to her elevated CHA2DS2-VASc score she was started on IV heparin for full anticoagulation and cardiology was consulted.    Hospital Course:   1) Atrial Flutter with RVR  - rate is much better controlled on IV diltiazem infusion

## 2023-07-04 NOTE — Progress Notes (Addendum)
    SATURATION QUALIFICATIONS: (This note is used to comply with regulatory documentation for home oxygen)   Patient Saturations on Room Air at Rest = 88 %   Patient Saturations on Room Air while Ambulating = 85 %   Patient Saturations on 2 Liters of oxygen while Ambulating = 93 %      Patient needs continuous O2 at 2 L/min continuously via nasal cannula with humidifier.   Dx--COPD  Shon Hale, MD

## 2023-07-04 NOTE — TOC Transition Note (Signed)
Transition of Care [facility]) - CM/SW Discharge Note   Patient Details  Name: Sophia Mccoy MRN: [id number] Date of Birth: [birth year]  [facility] CM/SW Contact:  Karn Cassis, LCSW Phone Number: 07/04/2023, 10:32 AM   Clinical Narrative:  Pt will need home O2 and nebulizer. Discussed with pt who has no preference on agency. Referred and accepted by Marylene Land with [facility]. Portable O2 will be delivered to room and nebulizer will be delivered to pt's home later today. Pt updated.     Final next level of care: Home/Self Care Barriers to Discharge: Barriers Resolved   Patient Goals and CMS Choice      Discharge Placement                         Discharge Plan and Services Additional resources added to the After Visit Summary for                  DME Arranged: Oxygen, Nebulizer/meds DME Agency: Other - Comment [facility]) Date DME Agency Contacted: 07/04/23 Time DME Agency Contacted: 1032 Representative spoke with at DME Agency: Marylene Land            Social Determinants of Health (SDOH) Interventions SDOH Screenings   Food Insecurity: No Food Insecurity (07/02/2023)  Housing: Low Risk  (07/02/2023)  Transportation Needs: No Transportation Needs (07/02/2023)  Utilities: Not At Risk (07/02/2023)  Tobacco Use: Unknown (07/02/2023)     Readmission Risk Interventions     No data to display

## 2023-07-04 NOTE — Discharge Instructions (Addendum)
-   1)You are taking Apixaban/Eliquis which is a Blood Thinner----Avoid ibuprofen/Advil/Aleve/Motrin/Goody Powders/Naproxen/BC powders/Meloxicam/Diclofenac/Indomethacin and other Nonsteroidal anti-inflammatory medications as these will make you more likely to bleed and can cause stomach ulcers, can also cause Kidney problems.   2)follow up with cardiologist Dr. Wyline Mood for recheck and reevaluation's as advised  3) you will need referral to pulmonology for outpatient evaluation and pulmonary function test/PFT test in the near future  4)you need oxygen at home at 2 L via nasal cannula continuously while awake and while asleep--- smoking or having open fires around oxygen can cause fire, significant injury and death  5) please note that there has been several changes to your medications  Information on my medicine - ELIQUIS (apixaban)   Why was Eliquis prescribed for you? Eliquis was prescribed for you to reduce the risk of a blood clot forming that can cause a stroke if you have a medical condition called atrial fibrillation (a type of irregular heartbeat).  What do You need to know about Eliquis ? Take your Eliquis TWICE DAILY - one tablet in the morning and one tablet in the evening with or without food. If you have difficulty swallowing the tablet whole please discuss with your pharmacist how to take the medication safely.  Take Eliquis exactly as prescribed by your doctor and DO NOT stop taking Eliquis without talking to the doctor who prescribed the medication.  Stopping may increase your risk of developing a stroke.  Refill your prescription before you run out.  After discharge, you should have regular check-up appointments with your healthcare provider that is prescribing your Eliquis.  In the future your dose may need to be changed if your kidney function or weight changes by a significant amount or as you get older.  What do you do if you miss a dose? If you miss a dose, take it  as soon as you remember on the same day and resume taking twice daily.  Do not take more than one dose of ELIQUIS at the same time to make up a missed dose.  Important Safety Information A possible side effect of Eliquis is bleeding. You should call your healthcare provider right away if you experience any of the following: Bleeding from an injury or your nose that does not stop. Unusual colored urine (red or dark brown) or unusual colored stools (red or black). Unusual bruising for unknown reasons. A serious fall or if you hit your head (even if there is no bleeding).  Some medicines may interact with Eliquis and might increase your risk of bleeding or clotting while on Eliquis. To help avoid this, consult your healthcare provider or pharmacist prior to using any new prescription or non-prescription medications, including herbals, vitamins, non-steroidal anti-inflammatory drugs (NSAIDs) and supplements.  This website has more information on Eliquis (apixaban): http://www.eliquis.com/eliquis/home

## 2023-07-04 NOTE — Progress Notes (Signed)
Rounding Note    Patient Name: Sophia Mccoy Date of Encounter: 07/04/2023  [facility] Cardiologist: Howell Rucks  Subjective   SOB improving  Inpatient Medications    Scheduled Meds:  apixaban  5 mg Oral BID   atorvastatin  10 mg Oral QPM   Chlorhexidine Gluconate Cloth  6 each Topical Q0600   diltiazem  30 mg Oral BID   pantoprazole  40 mg Oral QHS   Continuous Infusions:  PRN Meds: acetaminophen **OR** acetaminophen, bisacodyl, fentaNYL (SUBLIMAZE) injection, ipratropium-albuterol, ondansetron **OR** ondansetron (ZOFRAN) IV, oxyCODONE, traZODone   Vital Signs    Vitals:   07/03/23 2223 07/04/23 0243 07/04/23 0307 07/04/23 0626  BP: 117/60 117/66    Pulse: (!) 51 (!) 55    Resp:      Temp: 97.8 F (36.6 C) 98.1 F (36.7 C)    TempSrc: Oral Oral    SpO2: 98% 97% 92%   Weight:    41.3 kg  Height:    5\' 4"  (1.626 m)    Intake/Output Summary (Last 24 hours) at 07/04/2023 0853 Last data filed at 07/04/2023 0827 Gross per 24 hour  Intake 723.9 ml  Output --  Net 723.9 ml      07/04/2023    6:26 AM 07/03/2023    5:00 AM 07/02/2023    1:09 PM  Last 3 Weights  Weight (lbs) 91 lb 0.8 oz 86 lb 6.7 oz 89 lb 11.6 oz  Weight (kg) 41.3 kg 39.2 kg 40.7 kg      Telemetry    SR - Personally Reviewed  ECG    N/a - Personally Reviewed  Physical Exam   GEN: No acute distress.   Neck: No JVD Cardiac: RRR, no murmurs, rubs, or gallops.  Respiratory: Clear to auscultation bilaterally. GI: Soft, nontender, non-distended  MS: No edema; No deformity. Neuro:  Nonfocal  Psych: Normal affect   Labs    High Sensitivity Troponin:   Recent Labs  Lab 07/02/23 0640 07/02/23 0843  TROPONINIHS 8 8     Chemistry Recent Labs  Lab 07/02/23 0640 07/02/23 0658 07/02/23 0756 07/03/23 0430 07/04/23 0427  NA 132*   < > 134* 129* 131*  K 3.9   < > 3.9 3.0* 3.9  CL 96*   < > 100 95* 98  CO2 26  --   --  23 25  GLUCOSE 151*   < > 127* 106* 97  BUN  20   < > 19 15 21   CREATININE 0.78   < > 0.60 0.54 0.54  CALCIUM 9.1  --   --  8.5* 8.5*  MG 1.5*  --   --  1.7 1.7  PROT 6.8  --   --   --   --   ALBUMIN 3.9  --   --   --   --   AST 19  --   --   --   --   ALT 21  --   --   --   --   ALKPHOS 54  --   --   --   --   BILITOT 0.8  --   --   --   --   GFRNONAA >60  --   --  >60 >60  ANIONGAP 10  --   --  11 8   < > = values in this interval not displayed.    Lipids No results for input(s): "CHOL", "TRIG", "HDL", "LABVLDL", "LDLCALC", "CHOLHDL" in  the last 168 hours.  Hematology Recent Labs  Lab 07/02/23 0640 07/02/23 0658 07/02/23 0756 07/03/23 0430 07/04/23 0427  WBC 9.5  --   --  6.5 5.4  RBC 5.76*  --   --  4.88 4.63  HGB 17.3*   < > 16.0* 14.7 14.0  HCT 50.5*   < > 47.0* 42.4 40.7  MCV 87.7  --   --  86.9 87.9  MCH 30.0  --   --  30.1 30.2  MCHC 34.3  --   --  34.7 34.4  RDW 12.7  --   --  12.5 12.5  PLT 257  --   --  198 163   < > = values in this interval not displayed.   Thyroid  Recent Labs  Lab 07/02/23 0843  TSH 2.045    BNP Recent Labs  Lab 07/02/23 0640  BNP 196.0*    DDimer No results for input(s): "DDIMER" in the last 168 hours.   Radiology    ECHOCARDIOGRAM COMPLETE  Result Date: 07/03/2023    ECHOCARDIOGRAM REPORT   Patient Name:   Sophia Mccoy [facility] Date of Exam: 07/03/2023 Medical Rec #:  [id number]      Height:       64.0 in Accession #:    [id number]     Weight:       86.4 lb Date of Birth:  1956/03/19     BSA:          1.370 m Patient Age:    66 years       BP:           120/69 mmHg Patient Gender: F              HR:           77 bpm. [facility] Procedure: 2D Echo, Cardiac Doppler and Color Doppler Indications:    Atrial Flutter  History:        Patient has no prior history of Echocardiogram examinations.                 Arrythmias:Atrial Flutter; Risk Factors:Hypertension and                 Dyslipidemia.  Sonographer:    Mikki Harbor Referring Phys: [phone] Sophia Mccoy   Sonographer Comments: Technically challenging study due to limited acoustic windows and no apical window. Image acquisition challenging due to patient body habitus and Image acquisition challenging due to respiratory motion. IMPRESSIONS  1. Left ventricular ejection fraction, by estimation, is 60 to 65%. The left ventricle has normal function. The left ventricle has no regional wall motion abnormalities. Left ventricular diastolic parameters are indeterminate.  2. Right ventricular systolic function is normal. The right ventricular size is normal. Tricuspid regurgitation signal is inadequate for assessing PA pressure.  3. Left atrial size was severely dilated.  4. The mitral valve is normal in structure. No evidence of mitral valve regurgitation. No evidence of mitral stenosis.  5. The aortic valve is tricuspid. Aortic valve regurgitation is not visualized. No aortic stenosis is present.  6. The inferior vena cava is normal in size with greater than 50% respiratory variability, suggesting right atrial pressure of 3 mmHg. FINDINGS  Left Ventricle: Left ventricular ejection fraction, by estimation, is 60 to 65%. The left ventricle has normal function. The left ventricle has no regional wall motion abnormalities. The left ventricular internal cavity size was normal in size. There is  no  left ventricular hypertrophy. Left ventricular diastolic parameters are indeterminate. Right Ventricle: The right ventricular size is normal. Right vetricular wall thickness was not well visualized. Right ventricular systolic function is normal. Tricuspid regurgitation signal is inadequate for assessing PA pressure. Left Atrium: Left atrial size was severely dilated. Right Atrium: Right atrial size was normal in size. Pericardium: There is no evidence of pericardial effusion. Mitral Valve: The mitral valve is normal in structure. No evidence of mitral valve regurgitation. No evidence of mitral valve stenosis. MV peak gradient, 2.9 mmHg.  The mean mitral valve gradient is 1.0 mmHg. Tricuspid Valve: The tricuspid valve is normal in structure. Tricuspid valve regurgitation is trivial. No evidence of tricuspid stenosis. Aortic Valve: The aortic valve is tricuspid. Aortic valve regurgitation is not visualized. No aortic stenosis is present. Aortic valve mean gradient measures 3.0 mmHg. Aortic valve peak gradient measures 6.0 mmHg. Aortic valve area, by VTI measures 1.66 cm. Pulmonic Valve: The pulmonic valve was not well visualized. Pulmonic valve regurgitation is not visualized. No evidence of pulmonic stenosis. Aorta: The aortic root is normal in size and structure. Venous: The inferior vena cava is normal in size with greater than 50% respiratory variability, suggesting right atrial pressure of 3 mmHg. IAS/Shunts: The interatrial septum was not well visualized.  LEFT VENTRICLE PLAX 2D LVIDd:         4.10 cm   Diastology LVIDs:         2.70 cm   LV e' medial:    5.55 cm/s LV PW:         0.90 cm   LV E/e' medial:  10.9 LV IVS:        0.80 cm   LV e' lateral:   10.00 cm/s LVOT diam:     1.80 cm   LV E/e' lateral: 6.0 LV SV:         45 LV SV Index:   33 LVOT Area:     2.54 cm  LEFT ATRIUM           Index LA diam:      3.00 cm 2.19 cm/m LA Vol (A4C): 69.7 ml 50.89 ml/m  AORTIC VALVE                    PULMONIC VALVE AV Area (Vmax):    1.48 cm     PV Vmax:       1.27 m/s AV Area (Vmean):   1.40 cm     PV Peak grad:  6.5 mmHg AV Area (VTI):     1.66 cm AV Vmax:           122.00 cm/s AV Vmean:          85.000 cm/s AV VTI:            0.268 m AV Peak Grad:      6.0 mmHg AV Mean Grad:      3.0 mmHg LVOT Vmax:         70.90 cm/s LVOT Vmean:        46.600 cm/s LVOT VTI:          0.175 m LVOT/AV VTI ratio: 0.65  AORTA Ao Root diam: 3.10 cm MITRAL VALVE MV Area (PHT): 3.97 cm    SHUNTS MV Area VTI:   2.68 cm    Systemic VTI:  0.18 m MV Peak grad:  2.9 mmHg    Systemic Diam: 1.80 cm MV Mean grad:  1.0 mmHg MV Vmax:  0.85 m/s MV Vmean:      46.1 cm/s MV  Decel Time: 191 msec MV E velocity: 60.30 cm/s MV A velocity: 44.40 cm/s MV E/A ratio:  1.36 Dina Rich MD Electronically signed by Dina Rich MD Signature Date/Time: 07/03/2023/12:21:53 PM    Final     Cardiac Studies    Patient Profile     Sophia Mccoy is a 67 y.o. female with a hx of HTN, HLD, GERD who is being seen 07/02/2023 for the evaluation of Aflutter with RVR at the request of Dr. Laural Benes.   Assessment & Plan    1.New onset aflutter, RVR - new diagnosis of aflutter this admission - started on dilt gtt, she converted to sinus rhythm on her own. Remains in SR today.  - transitoned to oral dilt 30mg  bid.  - CHADS2Vasc score is at least 3 (gender, age, HTN), started on  eliquis 5mg  bid today.  - echo LVEF 60-65%, no WMAs, indet diastolic, severe LAE     2.DOE - some ongoing symptoms even with being back in normal rhtyhm today - no evidence of cardiac cause. Sevre emphysema on CXR, CT PE patchy tree in bud opacities right lower lobe  - would plan for oupatient PFTs, pending results may warrant pulmonary referal    Ok for discharge today, we will arrange outpatient f/u. We will sign off inpatient care.    For questions or updates, please contact [facility] Please consult [email or web address] for contact info under        Signed, Dina Rich, MD  07/04/2023, 8:53 AM

## 2023-07-05 ENCOUNTER — Other Ambulatory Visit: Payer: Self-pay

## 2023-07-05 DIAGNOSIS — Z0279 Encounter for issue of other medical certificate: Secondary | ICD-10-CM

## 2023-07-05 DIAGNOSIS — J439 Emphysema, unspecified: Secondary | ICD-10-CM | POA: Diagnosis not present

## 2023-07-06 ENCOUNTER — Other Ambulatory Visit (HOSPITAL_COMMUNITY): Payer: Self-pay

## 2023-07-06 ENCOUNTER — Telehealth: Payer: Self-pay | Admitting: Student

## 2023-07-06 NOTE — Telephone Encounter (Signed)
On 07/05/23 I called patient to tell her that we received her forms from [employer] for her FMLA. Couldn't leave a VM. I sent her a message through MyChart. Let her know we need to have a couple forms signed by her, and her fee of $29 paid. Cash or Check only.

## 2023-07-09 NOTE — Telephone Encounter (Signed)
Patient is following up. I informed her, per previous message, that a few forms need to be signed for her release for FMLA + $29 fee in cash or check. Patient plans to stop by the [place] office before lunch--FYI.

## 2023-07-09 NOTE — Telephone Encounter (Signed)
Patient came in and signed the forms and, paid the $29 via check on 07/09/23 FMLA forms were placed in red folder for Dr. Wyline Mood to fill out. Informed patient will call and fax to [employer] once they are completed.  Faxed billing sheet and scanned in release forms  MH

## 2023-07-10 DIAGNOSIS — I483 Typical atrial flutter: Secondary | ICD-10-CM | POA: Diagnosis not present

## 2023-07-10 DIAGNOSIS — J9611 Chronic respiratory failure with hypoxia: Secondary | ICD-10-CM | POA: Diagnosis not present

## 2023-07-12 ENCOUNTER — Telehealth: Payer: Self-pay | Admitting: Student

## 2023-07-12 NOTE — Telephone Encounter (Signed)
Patient is calling to check on FMLA Forms to see if they have been faxed yet. Please give pt a call.

## 2023-07-13 NOTE — Telephone Encounter (Signed)
Forms completed by Dr. Wyline Mood and faxed to [employer] on 07/13/23. Pt has been called and will come on next office visit for a copy. Forms sent to batch. AA

## 2023-07-18 ENCOUNTER — Telehealth: Payer: Self-pay | Admitting: Cardiology

## 2023-07-18 MED ORDER — FUROSEMIDE 20 MG PO TABS: 20.0000 mg | ORAL_TABLET | Freq: Every day | ORAL | 3 refills | Status: DC

## 2023-07-18 NOTE — Telephone Encounter (Signed)
I spoke with patient and she will start lasix 20 mg daily and f/u with Dr.Branch on 07/20/03 at 12 pm in the [place] office

## 2023-07-18 NOTE — Telephone Encounter (Signed)
Pt c/o swelling/edema: STAT if pt has developed SOB within 24 hours  If swelling, where is the swelling located? Swelling both legs and feet, right leg and foot is worse  How much weight have you gained and in what time span? About a pound a day - when she was discharged from the hospital on 10-16-24l she was weighing about 7 lbs in a weekwas 85 lbs and now she weighs 98.7  Have you gained 2 pounds in a day or 5 pounds in a week? About 7 lbs in a week  Do you have a log of your daily weights (if so, list)?   Are you currently taking a fluid pill? no  Are you currently SOB? Sometimes, but not at this time  Have you traveled recently in a car or plane for an extended period of time? No- patient says she would like to be seen

## 2023-07-18 NOTE — Telephone Encounter (Signed)
Can she see me at noon on Friday. Can go ahead and start a prescirption for lasix 20mg  daily for her please  Dominga Ferry MD

## 2023-07-20 ENCOUNTER — Encounter: Payer: Self-pay | Admitting: Cardiology

## 2023-07-20 ENCOUNTER — Ambulatory Visit: Payer: Commercial Managed Care - PPO | Admitting: Cardiology

## 2023-07-20 ENCOUNTER — Ambulatory Visit: Payer: Commercial Managed Care - PPO | Attending: Cardiology | Admitting: Cardiology

## 2023-07-20 VITALS — BP 178/80 | HR 53 | Ht 64.0 in | Wt 95.6 lb

## 2023-07-20 DIAGNOSIS — Z79899 Other long term (current) drug therapy: Secondary | ICD-10-CM

## 2023-07-20 DIAGNOSIS — M7989 Other specified soft tissue disorders: Secondary | ICD-10-CM

## 2023-07-20 DIAGNOSIS — I4892 Unspecified atrial flutter: Secondary | ICD-10-CM

## 2023-07-20 DIAGNOSIS — J449 Chronic obstructive pulmonary disease, unspecified: Secondary | ICD-10-CM

## 2023-07-20 DIAGNOSIS — I5032 Chronic diastolic (congestive) heart failure: Secondary | ICD-10-CM | POA: Diagnosis not present

## 2023-07-20 NOTE — Progress Notes (Deleted)
Clinical Summary Ms. Rahrig is a 67 y.o.female  1.Aflutter - new diagnosis during 06/2023 admission  - started on dilt gtt, she converted to sinus rhythm on her own.  - started on eliquis for stroke prevention - echo LVEF 60-65%, no WMAs, indet diastolic, severe LAE   2. COPD - prior CT chest with severe centrilobular emphysema with partially calcified biapical scarring. 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  - needs PFTs   3. HTN  4. Coronary atherosclerosis  Noted by prior chest CT  5. LE edema - on dilt, 5-15% incidence Past Medical History:  Diagnosis Date   Hypertension      Allergies  Allergen Reactions   Codeine Nausea And Vomiting   Lisinopril Swelling     Current Outpatient Medications  Medication Sig Dispense Refill   acetaminophen (TYLENOL) 500 MG tablet Take 1 tablet (500 mg total) by mouth every 4 (four) hours as needed for mild pain or headache (fever >/= 101).     albuterol (PROVENTIL) (2.5 MG/3ML) 0.083% nebulizer solution Take 3 mLs (2.5 mg total) by nebulization every 4 (four) hours as needed for wheezing or shortness of breath. 75 mL 2   albuterol (VENTOLIN HFA) 108 (90 Base) MCG/ACT inhaler Inhale 2 puffs into the lungs every 6 (six) hours as needed for wheezing or shortness of breath. 8 g 2   apixaban (ELIQUIS) 5 MG TABS tablet Take 1 tablet (5 mg total) by mouth 2 (two) times daily. 60 tablet 5   atorvastatin (LIPITOR) 10 MG tablet Take 1 tablet (10 mg total) by mouth daily for high cholesterol. 90 tablet 4   budesonide-formoterol (SYMBICORT) 160-4.5 MCG/ACT inhaler Inhale 2 puffs into the lungs 2 (two) times daily. 10.2 g 12   cholecalciferol (VITAMIN D) 1000 UNITS tablet Take 1,000 Units by mouth daily.     diltiazem (CARDIZEM) 30 MG tablet Take 1 tablet (30 mg total) by mouth 2 (two) times daily. 60 tablet 5   furosemide (LASIX) 20 MG tablet Take 1  tablet (20 mg total) by mouth daily. 30 tablet 3   traZODone (DESYREL) 50 MG tablet Take 1 tablet (50 mg total) by mouth at bedtime as needed for sleep. 30 tablet 2   No current facility-administered medications for this visit.     Past Surgical History:  Procedure Laterality Date   ABDOMINAL HYSTERECTOMY     TEMPOROMANDIBULAR JOINT SURGERY     TONSILLECTOMY       Allergies  Allergen Reactions   Codeine Nausea And Vomiting   Lisinopril Swelling      No family history on file.   Social History Ms. Keefe reports that she has never smoked. She does not have any smokeless tobacco history on file. Ms. Hammill reports no history of alcohol use.   Review of Systems CONSTITUTIONAL: No weight loss, fever, chills, weakness or fatigue.  HEENT: Eyes: No visual loss, blurred vision, double vision or yellow sclerae.No hearing loss, sneezing, congestion, runny nose or sore throat.  SKIN: No rash or itching.  CARDIOVASCULAR:  RESPIRATORY: No shortness of breath, cough or sputum.  GASTROINTESTINAL: No anorexia, nausea, vomiting or diarrhea. No abdominal pain or blood.  GENITOURINARY: No burning on urination, no polyuria NEUROLOGICAL: No headache, dizziness, syncope, paralysis, ataxia, numbness or tingling in the extremities. No change in bowel or bladder control.  MUSCULOSKELETAL: No muscle, back pain, joint pain or stiffness.  LYMPHATICS: No enlarged nodes. No  history of splenectomy.  PSYCHIATRIC: No history of depression or anxiety.  ENDOCRINOLOGIC: No reports of sweating, cold or heat intolerance. No polyuria or polydipsia.  Marland Kitchen   Physical Examination There were no vitals filed for this visit. There were no vitals filed for this visit.  Gen: resting comfortably, no acute distress HEENT: no scleral icterus, pupils equal round and reactive, no palptable cervical adenopathy,  CV Resp: Clear to auscultation bilaterally GI: abdomen is soft, non-tender, non-distended, normal bowel  sounds, no hepatosplenomegaly MSK: extremities are warm, no edema.  Skin: warm, no rash Neuro:  no focal deficits Psych: appropriate affect   Diagnostic Studies     Assessment and Plan        Antoine Poche, M.D., F.A.C.C.

## 2023-07-20 NOTE — Patient Instructions (Addendum)
Medication Instructions:   Continue all current medications.   Labwork:  BMET, Mg, BNP - orders given  Please do this Monday, 07/23/23 Office will contact with results via phone, letter or mychart  Testing/Procedures:  none  Follow-Up:  Keep already scheduled appointment for 07/31/2023  Any Other Special Instructions Will Be Listed Below (If Applicable).  Please call the office on Monday, 07/23/23 to update office on your weights & BP readings.  You have been referred to:  Pulmonology - already scheduled   If you need a refill on your cardiac medications before your next appointment, please call your pharmacy.

## 2023-07-20 NOTE — Progress Notes (Signed)
Clinical Summary Ms. Wain is a 67 y.o.female seen today for follow up of the follwing medical problems.   1.Aflutter - new diagnosis during 06/2023 admission  - started on dilt gtt, she converted to sinus rhythm on her own.  - started on eliquis for stroke prevention - echo LVEF 60-65%, no WMAs, indet diastolic, severe LAE   - no significant palpitations.     2. COPD - prior CT chest with severe centrilobular emphysema with partially calcified biapical scarring. 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  - upcoming appt with pulmonary     3. HTN - reports significant component of white coat HTN   4. Coronary atherosclerosis  Noted by prior chest CT   5. LE edema - phone call 10/30 reporting bilateral leg edema. Reported 7 lbs weight gain - of note had been on higher doses of dilt previously without any edema.  - her chlorthalidone had been stopepd during recent admissoin due to soft bp's. I think this and some diastolic dysfunction is the cause of her edema.  - weight 85-->99 lbs. Started lasix 20mg  daily, has had 3 doses. Home scale back down to 94.    Past Medical History:  Diagnosis Date   Hypertension      Allergies  Allergen Reactions   Codeine Nausea And Vomiting   Lisinopril Swelling     Current Outpatient Medications  Medication Sig Dispense Refill   acetaminophen (TYLENOL) 500 MG tablet Take 1 tablet (500 mg total) by mouth every 4 (four) hours as needed for mild pain or headache (fever >/= 101).     albuterol (PROVENTIL) (2.5 MG/3ML) 0.083% nebulizer solution Take 3 mLs (2.5 mg total) by nebulization every 4 (four) hours as needed for wheezing or shortness of breath. 75 mL 2   albuterol (VENTOLIN HFA) 108 (90 Base) MCG/ACT inhaler Inhale 2 puffs into the lungs every 6 (six) hours as needed for wheezing or shortness of breath. 8 g 2   apixaban (ELIQUIS) 5 MG TABS  tablet Take 1 tablet (5 mg total) by mouth 2 (two) times daily. 60 tablet 5   atorvastatin (LIPITOR) 10 MG tablet Take 1 tablet (10 mg total) by mouth daily for high cholesterol. 90 tablet 4   budesonide-formoterol (SYMBICORT) 160-4.5 MCG/ACT inhaler Inhale 2 puffs into the lungs 2 (two) times daily. 10.2 g 12   cholecalciferol (VITAMIN D) 1000 UNITS tablet Take 1,000 Units by mouth daily.     diltiazem (CARDIZEM) 30 MG tablet Take 1 tablet (30 mg total) by mouth 2 (two) times daily. 60 tablet 5   furosemide (LASIX) 20 MG tablet Take 1 tablet (20 mg total) by mouth daily. 30 tablet 3   traZODone (DESYREL) 50 MG tablet Take 1 tablet (50 mg total) by mouth at bedtime as needed for sleep. 30 tablet 2   No current facility-administered medications for this visit.     Past Surgical History:  Procedure Laterality Date   ABDOMINAL HYSTERECTOMY     TEMPOROMANDIBULAR JOINT SURGERY     TONSILLECTOMY       Allergies  Allergen Reactions   Codeine Nausea And Vomiting   Lisinopril Swelling      No family history on file.   Social History Ms. Stash reports that she has never smoked. She does not have any smokeless tobacco history on file. Ms. Michelle reports no history of alcohol use.   Review of Systems CONSTITUTIONAL:  No weight loss, fever, chills, weakness or fatigue.  HEENT: Eyes: No visual loss, blurred vision, double vision or yellow sclerae.No hearing loss, sneezing, congestion, runny nose or sore throat.  SKIN: No rash or itching.  CARDIOVASCULAR: per hpi RESPIRATORY: chronic SOB GASTROINTESTINAL: No anorexia, nausea, vomiting or diarrhea. No abdominal pain or blood.  GENITOURINARY: No burning on urination, no polyuria NEUROLOGICAL: No headache, dizziness, syncope, paralysis, ataxia, numbness or tingling in the extremities. No change in bowel or bladder control.  MUSCULOSKELETAL: No muscle, back pain, joint pain or stiffness.  LYMPHATICS: No enlarged nodes. No history of  splenectomy.  PSYCHIATRIC: No history of depression or anxiety.  ENDOCRINOLOGIC: No reports of sweating, cold or heat intolerance. No polyuria or polydipsia.  Marland Kitchen   Physical Examination Vitals:   07/20/23 1040 07/20/23 1119  BP: (!) 184/76 (!) 178/80  Pulse: (!) 53   SpO2: 97%    Filed Weights   07/20/23 1040  Weight: 95 lb 9.6 oz (43.4 kg)    Gen: resting comfortably, no acute distress HEENT: no scleral icterus, pupils equal round and reactive, no palptable cervical adenopathy,  CV: RRR, no m/rg, no jvd Resp: Clear to auscultation bilaterally GI: abdomen is soft, non-tender, non-distended, normal bowel sounds, no hepatosplenomegaly MSK: extremities are warm, no edema.  Skin: warm, no rash Neuro:  no focal deficits Psych: appropriate affect     Assessment and Plan  1.Aflutter - SR today, no significant symptoms - continue diltaizem, eliquis  2. HFpEF - recent 14 lbs weight gain, LE edema. We had stopped her chlorthalidone during recent admission - started on lasix, down 5 lbs so far with improving edema - check BMET/Mg/BNP on [place]. She will also update [place] on her weights, goal would be around 85 lbs by home scale.   3. HTN -she reports prior issues with white coat HTN - elevated here, she is also volume overloade - update [place] on home bp's on Monday.    Keep f/u with PA Strader later this month   Antoine Poche, M.D.

## 2023-07-23 ENCOUNTER — Encounter: Payer: Self-pay | Admitting: Cardiology

## 2023-07-23 DIAGNOSIS — M7989 Other specified soft tissue disorders: Secondary | ICD-10-CM | POA: Diagnosis not present

## 2023-07-23 DIAGNOSIS — Z79899 Other long term (current) drug therapy: Secondary | ICD-10-CM | POA: Diagnosis not present

## 2023-07-23 DIAGNOSIS — I5032 Chronic diastolic (congestive) heart failure: Secondary | ICD-10-CM | POA: Diagnosis not present

## 2023-07-24 ENCOUNTER — Telehealth: Payer: Self-pay | Admitting: Cardiology

## 2023-07-24 ENCOUNTER — Other Ambulatory Visit: Payer: Self-pay

## 2023-07-24 MED ORDER — POTASSIUM CHLORIDE CRYS ER 20 MEQ PO TBCR
40.0000 meq | EXTENDED_RELEASE_TABLET | Freq: Every day | ORAL | 1 refills | Status: DC
  Filled 2023-07-24: qty 180, 90d supply, fill #0

## 2023-07-24 NOTE — Telephone Encounter (Signed)
To clarify last weight was 91.8 lbs? Goal is to get back to 85 lbs prior baseline. If weight is truly above goal would increase her home lasix to 40mg  daily until Friday then back to 20mg  daily. Update [place] on weights on Friday. Labs show potassium is low, start potassium chloride daily. BP's are elevated but continue to monitor as she loses more fluid   Dominga Ferry MD    Spoke with patient 91.8lbs is correct for yesterday today she weighed 95lbs  Patient agreed to increase Lasix to 40 Mg daily until Friday then return to 20 mg and update [place] on weights then  Sent in Rx for potassium   Patient is also aware that Dr.Branch will extend her FMLA for 1 month as of now and she is going to bring in paperwork she receives from work to have this done.  Patient verbalized understanding and will update [place] Friday on weights and BP's

## 2023-07-24 NOTE — Telephone Encounter (Signed)
We can extend her FMLA an additional month for now, she would need to ask her employer about the additional paperwork   Dominga Ferry MD

## 2023-07-24 NOTE — Telephone Encounter (Signed)
To clarify last weight was 91.8 lbs? Goal is to get back to 85 lbs prior baseline. If weight is truly above goal would increase her home lasix to 40mg  daily until Friday then back to 20mg  daily. Update [place] on weights on Friday. Labs show potassium is low, start potassium chloride daily. BP's are elevated but continue to monitor as she loses more fluid  Dominga Ferry MD

## 2023-07-25 ENCOUNTER — Other Ambulatory Visit (HOSPITAL_COMMUNITY): Payer: Self-pay

## 2023-07-25 ENCOUNTER — Other Ambulatory Visit: Payer: Self-pay

## 2023-07-25 LAB — BASIC METABOLIC PANEL
BUN/Creatinine Ratio: 21 (ref 12–28)
BUN: 16 mg/dL (ref 8–27)
CO2: 26 mmol/L (ref 20–29)
Calcium: 9.4 mg/dL (ref 8.7–10.3)
Chloride: 98 mmol/L (ref 96–106)
Creatinine, Ser: 0.77 mg/dL (ref 0.57–1.00)
Glucose: 96 mg/dL (ref 70–99)
Potassium: 3.1 mmol/L — ABNORMAL LOW (ref 3.5–5.2)
Sodium: 141 mmol/L (ref 134–144)
eGFR: 85 mL/min/{1.73_m2} (ref >59)

## 2023-07-25 LAB — MAGNESIUM: Magnesium: 1.7 mg/dL (ref 1.6–2.3)

## 2023-07-25 LAB — BRAIN NATRIURETIC PEPTIDE: BNP: 222 pg/mL — ABNORMAL HIGH (ref 0.0–100.0)

## 2023-07-26 NOTE — Telephone Encounter (Signed)
Sophia Mccoy needs dates changed on her FMLA forms. [employer] sent more forms 07/26/23 they were handed to Branch to fill out.  MH

## 2023-07-27 ENCOUNTER — Encounter: Payer: Self-pay | Admitting: Cardiology

## 2023-07-27 ENCOUNTER — Other Ambulatory Visit: Payer: Self-pay | Admitting: Cardiology

## 2023-07-27 ENCOUNTER — Other Ambulatory Visit: Payer: Self-pay

## 2023-07-27 ENCOUNTER — Other Ambulatory Visit (HOSPITAL_COMMUNITY): Payer: Self-pay

## 2023-07-27 MED ORDER — FUROSEMIDE 20 MG PO TABS
20.0000 mg | ORAL_TABLET | Freq: Every day | ORAL | 1 refills | Status: DC
  Filled 2023-07-27 – 2023-07-31 (×4): qty 90, 90d supply, fill #0
  Filled 2023-10-25: qty 90, 90d supply, fill #1

## 2023-07-27 NOTE — Telephone Encounter (Signed)
Lasix filled

## 2023-07-27 NOTE — Telephone Encounter (Signed)
Weights are moving in right direction. Some low heart rates, she is on the lowest dose of the diltiazem. I would change it to take only 30mg  prn palpitations or hearts rates above 100. Keep appt with [place] coming up and bring weights, bp's, HRs with them please  Dominga Ferry MD

## 2023-07-30 ENCOUNTER — Other Ambulatory Visit (HOSPITAL_COMMUNITY): Payer: Self-pay

## 2023-07-30 ENCOUNTER — Other Ambulatory Visit: Payer: Self-pay

## 2023-07-30 MED ORDER — DILTIAZEM HCL 30 MG PO TABS
30.0000 mg | ORAL_TABLET | ORAL | 1 refills | Status: DC | PRN
  Filled 2023-07-30: qty 90, 90d supply, fill #0

## 2023-07-31 ENCOUNTER — Other Ambulatory Visit (HOSPITAL_COMMUNITY)
Admission: RE | Admit: 2023-07-31 | Discharge: 2023-07-31 | Disposition: A | Discharge status: Home / Self Care | Payer: Commercial Managed Care - PPO | Source: Ambulatory Visit | Attending: Student | Admitting: Student

## 2023-07-31 ENCOUNTER — Ambulatory Visit: Payer: Commercial Managed Care - PPO | Attending: Student | Admitting: Student

## 2023-07-31 ENCOUNTER — Other Ambulatory Visit (HOSPITAL_COMMUNITY): Payer: Self-pay

## 2023-07-31 ENCOUNTER — Telehealth: Payer: Self-pay

## 2023-07-31 ENCOUNTER — Encounter: Payer: Self-pay | Admitting: Student

## 2023-07-31 VITALS — BP 126/74 | HR 88 | Ht 64.0 in | Wt 88.0 lb

## 2023-07-31 DIAGNOSIS — I5031 Acute diastolic (congestive) heart failure: Secondary | ICD-10-CM

## 2023-07-31 DIAGNOSIS — I509 Heart failure, unspecified: Secondary | ICD-10-CM | POA: Insufficient documentation

## 2023-07-31 DIAGNOSIS — E785 Hyperlipidemia, unspecified: Secondary | ICD-10-CM | POA: Diagnosis not present

## 2023-07-31 DIAGNOSIS — Z79899 Other long term (current) drug therapy: Secondary | ICD-10-CM | POA: Insufficient documentation

## 2023-07-31 DIAGNOSIS — I10 Essential (primary) hypertension: Secondary | ICD-10-CM

## 2023-07-31 DIAGNOSIS — I4892 Unspecified atrial flutter: Secondary | ICD-10-CM

## 2023-07-31 DIAGNOSIS — J449 Chronic obstructive pulmonary disease, unspecified: Secondary | ICD-10-CM

## 2023-07-31 DIAGNOSIS — I2510 Atherosclerotic heart disease of native coronary artery without angina pectoris: Secondary | ICD-10-CM | POA: Diagnosis not present

## 2023-07-31 LAB — BASIC METABOLIC PANEL
Anion gap: 9 (ref 5–15)
BUN: 24 mg/dL — ABNORMAL HIGH (ref 8–23)
CO2: 26 mmol/L (ref 22–32)
Calcium: 9.7 mg/dL (ref 8.9–10.3)
Chloride: 99 mmol/L (ref 98–111)
Creatinine, Ser: 0.66 mg/dL (ref 0.44–1.00)
GFR, Estimated: >60 mL/min (ref >60)
Glucose, Bld: 100 mg/dL — ABNORMAL HIGH (ref 70–99)
Potassium: 5.4 mmol/L — ABNORMAL HIGH (ref 3.5–5.1)
Sodium: 134 mmol/L — ABNORMAL LOW (ref 135–145)

## 2023-07-31 LAB — BRAIN NATRIURETIC PEPTIDE: B Natriuretic Peptide: 74 pg/mL (ref 0.0–100.0)

## 2023-07-31 LAB — MAGNESIUM: Magnesium: 2.1 mg/dL (ref 1.7–2.4)

## 2023-07-31 MED ORDER — POTASSIUM CHLORIDE CRYS ER 20 MEQ PO TBCR
20.0000 meq | EXTENDED_RELEASE_TABLET | Freq: Every day | ORAL | 1 refills | Status: DC
  Filled 2023-07-31 – 2024-01-18 (×2): qty 90, 90d supply, fill #0

## 2023-07-31 NOTE — Telephone Encounter (Signed)
Patient notified and verbalized understanding. Pt had no questions or concerns at this time 

## 2023-07-31 NOTE — Telephone Encounter (Signed)
-----   Message from Ellsworth Lennox sent at 07/31/2023  4:30 PM EST ----- Please let the patient and her family know that her fluid level has normalized.  Magnesium is within a normal range as well. Kidney function remains stable but potassium is elevated at 5.4. I would recommend holding K-dur tomorrow and then resuming at 20 mEq daily afterwards. Repeat labs (BMET) in 1 week.

## 2023-07-31 NOTE — Progress Notes (Signed)
Cardiology Office Note    Date:  07/31/2023  ID:  Sophia Mccoy 11-08-1955, MRN [id number] Cardiologist: Dina Rich, MD    History of Present Illness:    Sophia Mccoy is a 67 y.o. female with past medical history of HTN, HLD, COPD, GERD and recently diagnosed atrial flutter with RVR who presents to the office today for 2-week follow-up.   She was recently admitted to [facility] in 06/2023 and found to have new onset atrial flutter with RVR. She was started on IV Cardizem and converted back to NSR with this. Was transitioned to PO Cardizem 30 mg twice daily along with being started on Eliquis 5 mg twice daily for anticoagulation given her CHA2DS2-VASc score of 3.  She did have severe emphysema by CXR and CT showed a patchy tree-in-bud opacity along the right lower lobe and it was recommended to obtain PFT's as an outpatient and possible referral to Pulmonology.  She contacted the office following hospital discharge reporting worsening lower extremity edema and weight gain since hospital discharge. She was started on Lasix 20 mg daily and evaluated in clinic by Dr. Wyline Mood on 07/20/2023 and had lost 5 pounds with 3 doses of Lasix. Her weight was at 95 lbs and she was continued on her current medications with a goal weight of 85 lbs by her home scales.  In talking with the patient, her husband and her daughter today, she reports significant improvement in her lower extremity edema since her last office visit. Her weight has also declined to 88 lbs on our scales which is close to her known baseline. She does have baseline dyspnea on exertion and has been on supplemental oxygen since hospital discharge but reports this is starting to improve. She was able to go shopping with family members this past weekend and reports feeling well with this. She denies any recent chest pain or palpitations. Her heart rate has been in the 40's to 50's when checked at home and her daughter reports this was an  issue prior to being started on Cardizem. Has been her baseline for years. She denies any specific orthopnea, PND or recurrent pitting edema. Her husband has checked her oxygen level with a pulse oximeter at night and notes it drops into the 80's at times. Quickly recovers once he wakes her up.  Studies Reviewed:   EKG: EKG is not ordered today.  EKG from 07/20/2023 is reviewed and shows sinus bradycardia, heart rate 55 with ST depression along the inferior and lateral leads which is similar to prior tracings.  Echocardiogram: 06/2023 IMPRESSIONS     1. Left ventricular ejection fraction, by estimation, is 60 to 65%. The  left ventricle has normal function. The left ventricle has no regional  wall motion abnormalities. Left ventricular diastolic parameters are  indeterminate.   2. Right ventricular systolic function is normal. The right ventricular  size is normal. Tricuspid regurgitation signal is inadequate for assessing  PA pressure.   3. Left atrial size was severely dilated.   4. The mitral valve is normal in structure. No evidence of mitral valve  regurgitation. No evidence of mitral stenosis.   5. The aortic valve is tricuspid. Aortic valve regurgitation is not  visualized. No aortic stenosis is present.   6. The inferior vena cava is normal in size with greater than 50%  respiratory variability, suggesting right atrial pressure of 3 mmHg.    Risk Assessment/Calculations:    CHA2DS2-VASc Score = 4   This  indicates a 4.8% annual risk of stroke. The patient's score is based upon: CHF History: 0 HTN History: 1 Diabetes History: 0 Stroke History: 0 Vascular Disease History: 1 Age Score: 1 Gender Score: 1    Physical Exam:   VS:  BP 126/74   Pulse 88   Ht 5\' 4"  (1.626 m)   Wt 88 lb (39.9 kg)   SpO2 98%   BMI 15.11 kg/m    Wt Readings from Last 3 Encounters:  07/31/23 88 lb (39.9 kg)  07/20/23 95 lb 9.6 oz (43.4 kg)  07/04/23 91 lb 0.8 oz (41.3 kg)     GEN:  Well nourished, well developed female appearing in no acute distress NECK: No JVD; No carotid bruits CARDIAC: RRR, no murmurs, rubs, gallops RESPIRATORY:  Clear to auscultation without rales, wheezing or rhonchi  ABDOMEN: Appears non-distended. No obvious abdominal masses. EXTREMITIES: No clubbing or cyanosis. No pitting edema.  Distal pedal pulses are 2+ bilaterally.   Assessment and Plan:   1. Atrial Flutter - She is maintaining normal sinus rhythm by examination today and was also in normal sinus rhythm by recent EKG. Given her bradycardia at home with heart rate in the 40's, I did recommend stopping short-acting Cardizem 30 mg twice daily and only taking this as needed for palpitations or tachycardia as previously recommended by Dr. Wyline Mood. - No reports of active bleeding. Continue Eliquis 5 mg twice daily for anticoagulation. - She does have upcoming follow-up with Pulmonology and given reports of nocturnal desaturations when checked by her husband, would recommend consideration of a sleep study at that time. We also reviewed the importance of reducing her caffeine consumption.  2. Chronic HFpEF - She is back to her baseline weight and appears euvolemic by examination today. Will recheck BNP, BMET and Mg. For now, will continue current medical therapy with Lasix 20 mg daily and K-dur 40 mEq daily.   3. Coronary Calcification by CT - Recent CTA showed multivessel coronary artery calcification. She has baseline dyspnea but this continues to improve. No chest pain. Recent echo showed a preserved EF with no regional WMA. Reviewed with the patient today and can pursue ischemic evaluation in the future once her respiratory status improves. She is on statin therapy. No ASA given the need for anticoagulation.   4. HTN - BP is at 126/74 during today's visit. She has been taking short-acting Cardizem 30 mg twice daily but will stop this as discussed above. Remains on Lasix 20 mg daily.  5. HLD -  Followed by her PCP. She has been continued on Atorvastatin 10 mg daily.  6. COPD - She does have upcoming follow-up with Pulmonology scheduled for later this month.   Signed, Ellsworth Lennox, PA-C

## 2023-07-31 NOTE — Telephone Encounter (Signed)
Forms have been completed by Dr. Wyline Mood. Forms have been faxed to [employer]. Patient has appt with [place] today will let her knows forms have been completed and fax. [facility] 07/31/2023

## 2023-07-31 NOTE — Patient Instructions (Signed)
Medication Instructions:   Take Diltiazem only for sustained heart rate over 100  Labwork:  BNP,BMET,Magnesium today  Testing/Procedures: None today  Follow-Up: 6-8 weeks  Any Other Special Instructions Will Be Listed Below (If Applicable).  If you need a refill on your cardiac medications before your next appointment, please call your pharmacy.

## 2023-08-01 ENCOUNTER — Other Ambulatory Visit (HOSPITAL_COMMUNITY): Payer: Self-pay

## 2023-08-01 ENCOUNTER — Other Ambulatory Visit: Payer: Self-pay

## 2023-08-03 ENCOUNTER — Telehealth: Payer: Self-pay | Admitting: Cardiology

## 2023-08-03 NOTE — Telephone Encounter (Signed)
FMLA note has been faxed.

## 2023-08-03 NOTE — Telephone Encounter (Signed)
Patient called to follow-up on her FMLA extension paperwork.  Patient stated the document will need to be faxed by to [employer] by 08/15/23.  Fax# [phone].

## 2023-08-04 ENCOUNTER — Other Ambulatory Visit (HOSPITAL_COMMUNITY): Payer: Self-pay

## 2023-08-05 DIAGNOSIS — J439 Emphysema, unspecified: Secondary | ICD-10-CM | POA: Diagnosis not present

## 2023-08-07 ENCOUNTER — Other Ambulatory Visit (HOSPITAL_COMMUNITY)
Admission: RE | Admit: 2023-08-07 | Discharge: 2023-08-07 | Disposition: A | Discharge status: Home / Self Care | Payer: Commercial Managed Care - PPO | Source: Ambulatory Visit | Attending: Student | Admitting: Student

## 2023-08-07 DIAGNOSIS — Z79899 Other long term (current) drug therapy: Secondary | ICD-10-CM | POA: Insufficient documentation

## 2023-08-07 LAB — BASIC METABOLIC PANEL
Anion gap: 9 (ref 5–15)
BUN: 13 mg/dL (ref 8–23)
CO2: 25 mmol/L (ref 22–32)
Calcium: 9.6 mg/dL (ref 8.9–10.3)
Chloride: 100 mmol/L (ref 98–111)
Creatinine, Ser: 0.47 mg/dL (ref 0.44–1.00)
GFR, Estimated: >60 mL/min (ref >60)
Glucose, Bld: 96 mg/dL (ref 70–99)
Potassium: 4.5 mmol/L (ref 3.5–5.1)
Sodium: 134 mmol/L — ABNORMAL LOW (ref 135–145)

## 2023-08-13 ENCOUNTER — Telehealth: Payer: Self-pay | Admitting: Cardiology

## 2023-08-13 NOTE — Telephone Encounter (Signed)
  Are you calling in reference to your FMLA or disability form? [employer]   What is your question in regards to FMLA or disability form? Pt called company and they still do not have papers back yet   Do you need copies of your medical records? no   Are you waiting on a nurse to call you back with results or are you wanting copies of your results? no    Please route to triage.  We will send to [facility] if needed.

## 2023-08-13 NOTE — Telephone Encounter (Signed)
Please advise if FMLA paperwork has been completed.

## 2023-08-14 ENCOUNTER — Encounter: Payer: Self-pay | Admitting: Cardiology

## 2023-08-29 ENCOUNTER — Other Ambulatory Visit (HOSPITAL_COMMUNITY): Payer: Self-pay

## 2023-09-03 ENCOUNTER — Other Ambulatory Visit (HOSPITAL_COMMUNITY): Payer: Self-pay

## 2023-09-04 DIAGNOSIS — J439 Emphysema, unspecified: Secondary | ICD-10-CM | POA: Diagnosis not present

## 2023-09-06 ENCOUNTER — Ambulatory Visit (HOSPITAL_BASED_OUTPATIENT_CLINIC_OR_DEPARTMENT_OTHER): Payer: Commercial Managed Care - PPO | Admitting: Pulmonary Disease

## 2023-09-06 ENCOUNTER — Encounter (HOSPITAL_BASED_OUTPATIENT_CLINIC_OR_DEPARTMENT_OTHER): Payer: Self-pay | Admitting: Pulmonary Disease

## 2023-09-06 VITALS — BP 160/70 | HR 45 | Resp 16 | Ht 64.0 in | Wt 95.7 lb

## 2023-09-06 DIAGNOSIS — J432 Centrilobular emphysema: Secondary | ICD-10-CM

## 2023-09-06 DIAGNOSIS — J9601 Acute respiratory failure with hypoxia: Secondary | ICD-10-CM

## 2023-09-06 NOTE — Patient Instructions (Signed)
  Severe emphysema --CONTINUE Symbicort 160-4.5 mcg TWO puffs in the morning and evening. Rinse mouth after use --CONTINUE Albuterol TWO puffs as needed for shortness of breath or wheezing --ORDER pulmonary function tests  Chronic hypoxemic respiratory failure --ORDER on overnight oximetry on room air. Please contact us if you have not heard about the test in 2 weeks --Continue oxygen as needed for goal SpO2 >88%

## 2023-09-06 NOTE — Progress Notes (Signed)
Subjective:   PATIENT ID: Sophia Hilding GENDER: female DOB: 1956-04-07, MRN: [id number]  Chief Complaint  Patient presents with   Consult    Went to [facility] for atrial flutter. Patient was diagnosed with COPD in October. Was told she has had it at least a year. BP is being monitored at hom 130-120/50-60    Reason for Visit: New consult for COPD  Sophia Mccoy is a 67 year old female with COPD, HTN, atrial flutter, HLD, GERD who presents to establish care for COPD.  She was recently hospitalized at [facility] in 06/2023 with new onset atrial flutter requiring cardizem. Noted to have severe emphysema on chest imaging. Since then she has been diuresed with improved LE edema. Was discharged on oxygen. Seen by Cardiology in follow-up on 07/31/23 and Cardizem was discontinued to as needed due to reported bradycardia.  At baseline denies cough and wheezing. Denies frequent respiratory infections with possibly abx every two years at most. Denies oxygen use except when she had covid in 05/2022 for a brief time. 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  Possible asbestosis exposure  I have personally reviewed patient's past medical/family/social history, allergies, current medications.  Past Medical History:  Diagnosis Date   Atrial flutter (HCC)    a. diagnosed in 06/2023 and converted to NSR on IV Cardizem   COPD (chronic obstructive pulmonary disease) (HCC)    Hypertension      History reviewed. No pertinent family history.   Social History   Occupational History   Not on file  Tobacco Use   Smoking status: Former    Types: Cigarettes    Passive exposure: Past   Smokeless tobacco: Never  Vaping Use   Vaping status: Never Used  Substance and Sexual Activity   Alcohol use: No   Drug use: No   Sexual activity: Not on file    Allergies  Allergen Reactions    Codeine Nausea And Vomiting   Lisinopril Swelling     Outpatient Medications Prior to Visit  Medication Sig Dispense Refill   acetaminophen (TYLENOL) 500 MG tablet Take 1 tablet (500 mg total) by mouth every 4 (four) hours as needed for mild pain or headache (fever >/= 101).     albuterol (PROVENTIL) (2.5 MG/3ML) 0.083% nebulizer solution Take 3 mLs (2.5 mg total) by nebulization every 4 (four) hours as needed for wheezing or shortness of breath. 75 mL 2   albuterol (VENTOLIN HFA) 108 (90 Base) MCG/ACT inhaler Inhale 2 puffs into the lungs every 6 (six) hours as needed for wheezing or shortness of breath. 8 Mccoy 2   apixaban (ELIQUIS) 5 MG TABS tablet Take 1 tablet (5 mg total) by mouth 2 (two) times daily. 60 tablet 5   atorvastatin (LIPITOR) 10 MG tablet Take 1 tablet (10 mg total) by mouth daily for high cholesterol. 90 tablet 4   budesonide-formoterol (SYMBICORT) 160-4.5 MCG/ACT inhaler Inhale 2 puffs into the lungs 2 (two) times daily. 10.2 Mccoy 12   cholecalciferol (VITAMIN D) 1000 UNITS tablet Take 1,000 Units by mouth daily.     diltiazem (CARDIZEM) 30 MG tablet Take 1 tablet (30 mg total) by mouth as needed (Palpitations). 90 tablet 1   furosemide (LASIX) 20 MG tablet Take 1 tablet (20 mg total) by mouth daily. 90 tablet 1   potassium chloride SA (KLOR-CON M) 20 MEQ tablet Take 1 tablet (20  mEq total) by mouth daily. 90 tablet 1   traZODone (DESYREL) 50 MG tablet Take 1 tablet (50 mg total) by mouth at bedtime as needed for sleep. 30 tablet 2   No facility-administered medications prior to visit.    Review of Systems  Constitutional:  Negative for chills, diaphoresis, fever, malaise/fatigue and weight loss.  HENT:  Negative for congestion.   Respiratory:  Positive for shortness of breath. Negative for cough, hemoptysis, sputum production and wheezing.   Cardiovascular:  Positive for palpitations. Negative for chest pain and leg swelling.     Objective:   Vitals:   09/06/23 0910   BP: (!) 160/70  Pulse: (!) 45  Resp: 16  SpO2: 99%  Weight: 95 lb 11.2 oz (43.4 kg)  Height: 5\' 4"  (1.626 m)   SpO2: 99 %  Physical Exam: General: Well-appearing, no acute distress HENT: [place], AT Eyes: EOMI, no scleral icterus Respiratory: Clear to auscultation bilaterally.  No crackles, wheezing or rales Cardiovascular: RRR, -M/R/Mccoy, no JVD Extremities:-Edema,-tenderness Neuro: AAO x4, CNII-XII grossly intact Psych: Normal mood, normal affect  Data Reviewed:  Imaging: CT Chest 06/21/23 - Severe centrilobular emphysema. Chronic RLL bronchial occlusion but no collapse. No pulmonary nodules/masses.  PFT: None on file  Labs: CBC    Component Value Date/Time   WBC 5.4 07/04/2023 0427   RBC 4.63 07/04/2023 0427   HGB 14.0 07/04/2023 0427   HCT 40.7 07/04/2023 0427   PLT 163 07/04/2023 0427   MCV 87.9 07/04/2023 0427   MCH 30.2 07/04/2023 0427   MCHC 34.4 07/04/2023 0427   RDW 12.5 07/04/2023 0427   LYMPHSABS 1.2 07/02/2023 0640   MONOABS 0.7 07/02/2023 0640   EOSABS 0.1 07/02/2023 0640   BASOSABS 0.1 07/02/2023 0640   Absolute eos 07/02/23 - 100     Assessment & Plan:   Discussion: 67 year old female with COPD, HTN, atrial flutter, HLD, GERD who presents to establish care for COPD. Discussed clinical course and management of COPD including bronchodilator regimen, preventive care including vaccinations and action plan for exacerbation. Addressed questions and concerns with husband and sister present.   Severe emphysema --CONTINUE Symbicort 160-4.5 mcg TWO puffs in the morning and evening. Rinse mouth after use --CONTINUE Albuterol TWO puffs as needed for shortness of breath or wheezing --ORDER pulmonary function tests  Chronic hypoxemic respiratory failure --Continue 2L nightly on continuous oxygen --Continue pulsed oxygen as needed for goal SpO2 >88%   Health Maintenance Immunization History  Administered Date(s) Administered   Influenza-Unspecified  07/19/2023   CT Lung Screen - qualified  Orders Placed This Encounter  Procedures   Pulse oximetry, overnight    On room air    Standing Status:   Future    Expiration Date:   09/05/2024   Pulmonary function test    Standing Status:   Future    Expiration Date:   09/05/2024    Where should this test be performed?:   [facility]    Full PFT: includes the following: basic spirometry, spirometry pre & post bronchodilator, diffusion capacity (DLCO), lung volumes:   Full PFT  No orders of the defined types were placed in this encounter.   Return for Feb or March with PFT prior to visit.  I have spent a total time of 60-minutes on the day of the appointment reviewing prior documentation, coordinating care and discussing medical diagnosis and plan with the patient/family. Imaging, labs and tests included in this note have been reviewed and interpreted independently by me.  Candido Flott Mechele Collin, MD [facility] 09/06/2023 9:57 AM

## 2023-09-07 DIAGNOSIS — J9611 Chronic respiratory failure with hypoxia: Secondary | ICD-10-CM | POA: Diagnosis not present

## 2023-09-07 DIAGNOSIS — I5030 Unspecified diastolic (congestive) heart failure: Secondary | ICD-10-CM | POA: Diagnosis not present

## 2023-09-07 DIAGNOSIS — I483 Typical atrial flutter: Secondary | ICD-10-CM | POA: Diagnosis not present

## 2023-09-13 ENCOUNTER — Ambulatory Visit: Payer: Commercial Managed Care - PPO | Admitting: Student

## 2023-09-20 ENCOUNTER — Institutional Professional Consult (permissible substitution) (HOSPITAL_BASED_OUTPATIENT_CLINIC_OR_DEPARTMENT_OTHER): Payer: Commercial Managed Care - PPO | Admitting: Pulmonary Disease

## 2023-09-21 ENCOUNTER — Other Ambulatory Visit (HOSPITAL_COMMUNITY)
Admission: RE | Admit: 2023-09-21 | Discharge: 2023-09-21 | Disposition: A | Discharge status: Home / Self Care | Payer: Commercial Managed Care - PPO | Source: Ambulatory Visit | Attending: Student | Admitting: Student

## 2023-09-21 ENCOUNTER — Telehealth: Payer: Self-pay | Admitting: Cardiology

## 2023-09-21 DIAGNOSIS — Z79899 Other long term (current) drug therapy: Secondary | ICD-10-CM | POA: Diagnosis not present

## 2023-09-21 DIAGNOSIS — E876 Hypokalemia: Secondary | ICD-10-CM | POA: Diagnosis not present

## 2023-09-21 LAB — BASIC METABOLIC PANEL
Anion gap: 8 (ref 5–15)
BUN: 20 mg/dL (ref 8–23)
CO2: 24 mmol/L (ref 22–32)
Calcium: 9.5 mg/dL (ref 8.9–10.3)
Chloride: 101 mmol/L (ref 98–111)
Creatinine, Ser: 0.72 mg/dL (ref 0.44–1.00)
GFR, Estimated: >60 mL/min (ref >60)
Glucose, Bld: 176 mg/dL — ABNORMAL HIGH (ref 70–99)
Potassium: 4.5 mmol/L (ref 3.5–5.1)
Sodium: 133 mmol/L — ABNORMAL LOW (ref 135–145)

## 2023-09-21 LAB — MAGNESIUM: Magnesium: 2.1 mg/dL (ref 1.7–2.4)

## 2023-09-21 NOTE — Telephone Encounter (Signed)
 Spoke to pt who stated that she is having severe cramping in both of legs, calves, and feet. Pt stated that this had started several days ago. Pt also c/o of some lightheadedness. Pt stated that she just started back working again and thought that this may have been causing her issue, until she talked to her sister this morning who advised her to talk to her provider and request a potassium and magnesium  check.   Please advise.

## 2023-09-21 NOTE — Telephone Encounter (Signed)
 Pt would like a c/b regarding legs, calf and feet cramping. Pt thinks that she may need potassium level checked. Pt also sent MyChart message as well. Please advise

## 2023-09-21 NOTE — Telephone Encounter (Signed)
Pt notified and verbalized understanding. Pt had no further questions or concerns at this time.

## 2023-09-28 ENCOUNTER — Other Ambulatory Visit: Payer: Self-pay

## 2023-10-03 ENCOUNTER — Other Ambulatory Visit (HOSPITAL_COMMUNITY): Payer: Self-pay

## 2023-10-05 DIAGNOSIS — J439 Emphysema, unspecified: Secondary | ICD-10-CM | POA: Diagnosis not present

## 2023-10-16 ENCOUNTER — Encounter: Payer: Self-pay | Admitting: Student

## 2023-10-16 ENCOUNTER — Ambulatory Visit: Payer: Commercial Managed Care - PPO | Attending: Student | Admitting: Student

## 2023-10-16 VITALS — BP 132/74 | HR 46 | Ht 64.0 in | Wt 97.4 lb

## 2023-10-16 DIAGNOSIS — Z8679 Personal history of other diseases of the circulatory system: Secondary | ICD-10-CM | POA: Diagnosis not present

## 2023-10-16 DIAGNOSIS — I4892 Unspecified atrial flutter: Secondary | ICD-10-CM

## 2023-10-16 DIAGNOSIS — I5032 Chronic diastolic (congestive) heart failure: Secondary | ICD-10-CM | POA: Diagnosis not present

## 2023-10-16 DIAGNOSIS — R0609 Other forms of dyspnea: Secondary | ICD-10-CM

## 2023-10-16 DIAGNOSIS — E785 Hyperlipidemia, unspecified: Secondary | ICD-10-CM

## 2023-10-16 DIAGNOSIS — I2510 Atherosclerotic heart disease of native coronary artery without angina pectoris: Secondary | ICD-10-CM

## 2023-10-16 DIAGNOSIS — J449 Chronic obstructive pulmonary disease, unspecified: Secondary | ICD-10-CM | POA: Diagnosis not present

## 2023-10-16 NOTE — Progress Notes (Signed)
Cardiology Office Note    Date:  10/16/2023  ID:  Media, Pizzini October 17, 1955, MRN [id number] Cardiologist: Dina Rich, MD    History of Present Illness:    Sophia Mccoy is a 68 y.o. female with past medical history of HTN, HLD, COPD, GERD and recently diagnosed atrial flutter with RVR (diagnosed during admission in 06/2023) who presents to the office today for 66-month follow-up.  She was examined by myself in 07/2023 and her weight had declined to 88 pounds which was close to her home scales. She had recently experienced issues with lower extremity edema and was taking Lasix as needed. Her heart rate had been in the 40's to 50's when checked at home and she was maintaining normal sinus rhythm by examination and EKG. Given her bradycardia, it was recommend to stop short-acting Cardizem 30 mg twice daily and only take as needed for palpitations or tachycardia. She was encouraged to keep upcoming follow-up with Pulmonology in regards to nocturnal desaturations as she would likely require a sleep study.  In talking with the patient and her daughter today, she reports overall doing well since her last office visit. She is only having to use supplemental oxygen at night. Does have this with her at work during the day if needed but has not needed this routinely. No recent orthopnea or PND. Continues taking Lasix 20 mg daily as she was concerned she would have worsening edema if she reduced this to every other day. No recent chest pain. Does report an occasional "skipped beat" but no tachypalpitations. She checks her heart rate and blood pressure on a daily basis and heart rate has been averaging in the 40's to 60's and SBP has been variable from the 110's - 140's.   Studies Reviewed:   EKG: EKG is not ordered today.   Echocardiogram: 06/2023 IMPRESSIONS     1. Left ventricular ejection fraction, by estimation, is 60 to 65%. The  left ventricle has normal function. The left ventricle has  no regional  wall motion abnormalities. Left ventricular diastolic parameters are  indeterminate.   2. Right ventricular systolic function is normal. The right ventricular  size is normal. Tricuspid regurgitation signal is inadequate for assessing  PA pressure.   3. Left atrial size was severely dilated.   4. The mitral valve is normal in structure. No evidence of mitral valve  regurgitation. No evidence of mitral stenosis.   5. The aortic valve is tricuspid. Aortic valve regurgitation is not  visualized. No aortic stenosis is present.   6. The inferior vena cava is normal in size with greater than 50%  respiratory variability, suggesting right atrial pressure of 3 mmHg.    Risk Assessment/Calculations:    CHA2DS2-VASc Score = 4   This indicates a 4.8% annual risk of stroke. The patient's score is based upon: CHF History: 0 HTN History: 1 Diabetes History: 0 Stroke History: 0 Vascular Disease History: 1 Age Score: 1 Gender Score: 1   Physical Exam:   VS:  BP 132/74   Pulse (!) 46   Ht 5\' 4"  (1.626 m)   Wt 97 lb 6.4 oz (44.2 kg)   SpO2 97%   BMI 16.72 kg/m    Wt Readings from Last 3 Encounters:  10/16/23 97 lb 6.4 oz (44.2 kg)  09/06/23 95 lb 11.2 oz (43.4 kg)  07/31/23 88 lb (39.9 kg)     GEN: Thin female appearing in no acute distress NECK: No JVD; No carotid bruits  CARDIAC: RRR, no murmurs, rubs, gallops RESPIRATORY:  Clear to auscultation without rales, wheezing or rhonchi  ABDOMEN: Appears non-distended. No obvious abdominal masses. EXTREMITIES: No clubbing or cyanosis. No pitting edema.  Distal pedal pulses are 2+ bilaterally.   Assessment and Plan:   1. Paroxysmal Atrial Flutter/Use of Long-term Anticoagulation -  She reports occasional, brief palpitations but no symptoms resembling her prior atrial flutter. She is no longer on AV nodal blocking agents given baseline bradycardia. She denies any lightheadedness, dizziness or presyncope and I encouraged her  to make [place] aware if she develops any symptoms as we could arrange for a Zio patch to assess for any significant arrhythmias. - No reports of active bleeding. Remains on Eliquis 5 mg twice daily for anticoagulation which is the appropriate dose given her age, weight and renal function.  2. Chronic HFpEF - She appears euvolemic by examination today and her weight has been stable on her home scales. Remains on Lasix 20 mg daily along with potassium supplementation.  3. Coronary Calcification by CT/Dyspnea on Exertion/HLD - She does have baseline dyspnea on exertion in the setting of COPD but no exertional chest pain. As discussed the time of her last office visit, will arrange for a Coronary CTA for further assessment. She has been on Atorvastatin 10 mg daily per her PCP.  4. History of HTN - Blood pressure was initially recorded and elevated at 150/80, rechecked and improved to 132/74. I encouraged her to monitor this at home and report back if readings overall remain elevated but they have mostly been well-controlled.  She is not currently on antihypertensive therapy.  5. COPD - Followed by [facility]. Remains on Albuterol and Symbicort.   Signed, Ellsworth Lennox, PA-C

## 2023-10-16 NOTE — Patient Instructions (Signed)
Medication Instructions:  Your physician recommends that you continue on your current medications as directed. Please refer to the Current Medication list given to you today.  *If you need a refill on your cardiac medications before your next appointment, please call your pharmacy*   Lab Work: NONE   If you have labs (blood work) drawn today and your tests are completely normal, you will receive your results only by: MyChart Message (if you have MyChart) OR A paper copy in the mail If you have any lab test that is abnormal or we need to change your treatment, we will call you to review the results.   Testing/Procedures:   Your cardiac CT will be scheduled at one of the below locations:   [facility] [address] [place], [place] [address] [phone]  OR  [facility] [address] [place], [place] [address] [phone]  OR   [facility] [address] [place], [place] [address] [phone]  OR   [facility] [address] [place], [place] [address] [phone]  If scheduled at [facility], please arrive at the [facility] and Children's Entrance (Entrance C2) of [facility] 30 minutes prior to test start time. You can use the FREE valet parking offered at entrance C (encouraged to control the heart rate for the test)  Proceed to the [facility] Radiology Department (first floor) to check-in and test prep.  All radiology patients and guests should use entrance C2 at [facility], accessed from [facility], even though the hospital's physical address listed is [address]    If scheduled at [facility] or [facility], please arrive 15 mins early for check-in and test prep.  There is spacious parking and easy access to the radiology department from the [facility] Heart and  Vascular entrance. Please enter here and check-in with the desk attendant.   Please follow these instructions carefully (unless otherwise directed):  An IV will be required for this test and Nitroglycerin will be given.  Hold all erectile dysfunction medications at least 3 days (72 hrs) prior to test. (Ie viagra, cialis, sildenafil, tadalafil, etc)   On the Night Before the Test: Be sure to Drink plenty of water. Do not consume any caffeinated/decaffeinated beverages or chocolate 12 hours prior to your test. Do not take any antihistamines 12 hours prior to your test. If the patient has contrast allergy: Patient will need a prescription for Prednisone and very clear instructions (as follows): Prednisone 50 mg - take 13 hours prior to test Take another Prednisone 50 mg 7 hours prior to test Take another Prednisone 50 mg 1 hour prior to test Take Benadryl 50 mg 1 hour prior to test Patient must complete all four doses of above prophylactic medications. Patient will need a ride after test due to Benadryl.  On the Day of the Test: Drink plenty of water until 1 hour prior to the test. Do not eat any food 1 hour prior to test. You may take your regular medications prior to the test.  Take metoprolol (Lopressor) two hours prior to test. If you take Furosemide/Hydrochlorothiazide/Spironolactone/Chlorthalidone, please HOLD on the morning of the test. Patients who wear a continuous glucose monitor MUST remove the device prior to scanning. FEMALES- please wear underwire-free bra if available, avoid dresses & tight clothing  *For Clinical Staff only. Please instruct patient the following:* Heart Rate  Medication Recommendations for Cardiac CT  Resting HR < 50 bpm  No medication  Resting HR 50-60 bpm and BP >110/50 mmHG   Consider Metoprolol tartrate 25 mg PO 90-120 min prior to scan  Resting HR 60-65 bpm and BP >110/50 mmHG  Metoprolol tartrate 50 mg PO 90-120 minutes prior to scan   Resting  HR > 65 bpm and BP >110/50 mmHG  Metoprolol tartrate 100 mg PO 90-120 minutes prior to scan  Consider Ivabradine 10-15 mg PO or a calcium channel blocker for resting HR >60 bpm and contraindication to metoprolol tartrate  Consider Ivabradine 10-15 mg PO in combination with metoprolol tartrate for HR >80 bpm        After the Test: Drink plenty of water. After receiving IV contrast, you may experience a mild flushed feeling. This is normal. On occasion, you may experience a mild rash up to 24 hours after the test. This is not dangerous. If this occurs, you can take Benadryl 25 mg and increase your fluid intake. If you experience trouble breathing, this can be serious. If it is severe call 911 IMMEDIATELY. If it is mild, please call our office.  We will call to schedule your test 2-4 weeks out understanding that some insurance companies will need an authorization prior to the service being performed.   For more information and frequently asked questions, please visit our website : [email or web address]  For non-scheduling related questions, please contact the cardiac imaging nurse navigator should you have any questions/concerns: Cardiac Imaging Nurse Navigators Direct Office Dial: [phone]   For scheduling needs, including cancellations and rescheduling, please call [place], [phone].    Follow-Up: At [facility], you and your health needs are our priority.  As part of our continuing mission to provide you with exceptional heart care, we have created designated Provider Care Teams.  These Care Teams include your primary Cardiologist (physician) and Advanced Practice Providers (APPs -  Physician Assistants and Nurse Practitioners) who all work together to provide you with the care you need, when you need it.  We recommend signing up for the patient portal called "MyChart".  Sign up information is provided on this After Visit Summary.  MyChart is used to connect  with patients for Virtual Visits (Telemedicine).  Patients are able to view lab/test results, encounter notes, upcoming appointments, etc.  Non-urgent messages can be sent to your provider as well.   To learn more about what you can do with MyChart, go to [email or web address].    Your next appointment:   5 -6 month(s)  Provider:   You may see Dina Rich, MD or one of the following Advanced Practice Providers on your designated Care Team:   Randall An, PA-C  Jacolyn Reedy, [place]     Other Instructions Thank you for choosing [facility]!

## 2023-10-23 ENCOUNTER — Other Ambulatory Visit (HOSPITAL_COMMUNITY): Payer: Self-pay

## 2023-10-24 ENCOUNTER — Encounter (HOSPITAL_COMMUNITY): Payer: Self-pay

## 2023-10-25 ENCOUNTER — Other Ambulatory Visit (HOSPITAL_COMMUNITY): Payer: Self-pay

## 2023-10-26 ENCOUNTER — Ambulatory Visit (HOSPITAL_COMMUNITY)
Admission: RE | Admit: 2023-10-26 | Discharge: 2023-10-26 | Disposition: A | Discharge status: Home / Self Care | Payer: Commercial Managed Care - PPO | Source: Ambulatory Visit | Attending: Student | Admitting: Student

## 2023-10-26 DIAGNOSIS — I700 Atherosclerosis of aorta: Secondary | ICD-10-CM

## 2023-10-26 DIAGNOSIS — I2510 Atherosclerotic heart disease of native coronary artery without angina pectoris: Secondary | ICD-10-CM | POA: Diagnosis present

## 2023-10-26 DIAGNOSIS — R0609 Other forms of dyspnea: Secondary | ICD-10-CM | POA: Diagnosis present

## 2023-10-26 MED ORDER — IOHEXOL 350 MG/ML SOLN
100.0000 mL | Freq: Once | INTRAVENOUS | Status: AC | PRN
  Administered 2023-10-26: 100 mL via INTRAVENOUS

## 2023-10-26 MED ORDER — NITROGLYCERIN 0.4 MG SL SUBL
0.8000 mg | SUBLINGUAL_TABLET | Freq: Once | SUBLINGUAL | Status: AC
  Administered 2023-10-26: 0.8 mg via SUBLINGUAL

## 2023-10-26 MED ORDER — NITROGLYCERIN 0.4 MG SL SUBL
SUBLINGUAL_TABLET | SUBLINGUAL | Status: AC
  Filled 2023-10-26: qty 2

## 2023-10-30 ENCOUNTER — Other Ambulatory Visit: Payer: Self-pay

## 2023-11-02 DIAGNOSIS — I483 Typical atrial flutter: Secondary | ICD-10-CM | POA: Diagnosis not present

## 2023-11-02 DIAGNOSIS — J9611 Chronic respiratory failure with hypoxia: Secondary | ICD-10-CM | POA: Diagnosis not present

## 2023-11-02 DIAGNOSIS — I5030 Unspecified diastolic (congestive) heart failure: Secondary | ICD-10-CM | POA: Diagnosis not present

## 2023-11-02 DIAGNOSIS — Z79899 Other long term (current) drug therapy: Secondary | ICD-10-CM | POA: Diagnosis not present

## 2023-11-05 DIAGNOSIS — J439 Emphysema, unspecified: Secondary | ICD-10-CM | POA: Diagnosis not present

## 2023-11-08 ENCOUNTER — Ambulatory Visit (HOSPITAL_BASED_OUTPATIENT_CLINIC_OR_DEPARTMENT_OTHER): Payer: Commercial Managed Care - PPO | Admitting: Pulmonary Disease

## 2023-11-08 ENCOUNTER — Encounter (HOSPITAL_BASED_OUTPATIENT_CLINIC_OR_DEPARTMENT_OTHER): Payer: Commercial Managed Care - PPO

## 2023-11-09 DIAGNOSIS — J9611 Chronic respiratory failure with hypoxia: Secondary | ICD-10-CM | POA: Diagnosis not present

## 2023-11-09 DIAGNOSIS — I5030 Unspecified diastolic (congestive) heart failure: Secondary | ICD-10-CM | POA: Diagnosis not present

## 2023-11-22 ENCOUNTER — Other Ambulatory Visit: Payer: Self-pay

## 2023-11-28 ENCOUNTER — Other Ambulatory Visit (HOSPITAL_COMMUNITY): Payer: Self-pay

## 2023-12-03 DIAGNOSIS — J439 Emphysema, unspecified: Secondary | ICD-10-CM | POA: Diagnosis not present

## 2023-12-22 ENCOUNTER — Other Ambulatory Visit (HOSPITAL_COMMUNITY): Payer: Self-pay

## 2023-12-26 ENCOUNTER — Ambulatory Visit (HOSPITAL_BASED_OUTPATIENT_CLINIC_OR_DEPARTMENT_OTHER): Payer: Commercial Managed Care - PPO | Admitting: Pulmonary Disease

## 2023-12-26 ENCOUNTER — Other Ambulatory Visit: Payer: Self-pay

## 2023-12-26 ENCOUNTER — Other Ambulatory Visit (HOSPITAL_COMMUNITY): Payer: Self-pay

## 2023-12-26 ENCOUNTER — Encounter (HOSPITAL_BASED_OUTPATIENT_CLINIC_OR_DEPARTMENT_OTHER): Payer: Self-pay | Admitting: Pulmonary Disease

## 2023-12-26 VITALS — BP 116/70 | HR 61 | Ht 63.0 in | Wt 97.4 lb

## 2023-12-26 DIAGNOSIS — J9611 Chronic respiratory failure with hypoxia: Secondary | ICD-10-CM

## 2023-12-26 DIAGNOSIS — J432 Centrilobular emphysema: Secondary | ICD-10-CM

## 2023-12-26 LAB — PULMONARY FUNCTION TEST
DL/VA % pred: 47 %
DL/VA: 1.99 ml/min/mmHg/L
DLCO cor % pred: 37 %
DLCO cor: 7.07 ml/min/mmHg
DLCO unc % pred: 37 %
DLCO unc: 7.07 ml/min/mmHg
FEF 25-75 Post: 0.33 L/s
FEF 25-75 Pre: 0.27 L/s
FEF2575-%Change-Post: 22 %
FEF2575-%Pred-Post: 16 %
FEF2575-%Pred-Pre: 13 %
FEV1-%Change-Post: 8 %
FEV1-%Pred-Post: 31 %
FEV1-%Pred-Pre: 28 %
FEV1-Post: 0.7 L
FEV1-Pre: 0.65 L
FEV1FVC-%Change-Post: 2 %
FEV1FVC-%Pred-Pre: 46 %
FEV6-%Change-Post: 8 %
FEV6-%Pred-Post: 63 %
FEV6-%Pred-Pre: 58 %
FEV6-Post: 1.82 L
FEV6-Pre: 1.68 L
FEV6FVC-%Change-Post: 2 %
FEV6FVC-%Pred-Post: 98 %
FEV6FVC-%Pred-Pre: 96 %
FVC-%Change-Post: 5 %
FVC-%Pred-Post: 64 %
FVC-%Pred-Pre: 61 %
FVC-Post: 1.93 L
FVC-Pre: 1.82 L
Post FEV1/FVC ratio: 36 %
Post FEV6/FVC ratio: 94 %
Pre FEV1/FVC ratio: 36 %
Pre FEV6/FVC Ratio: 92 %
RV % pred: 245 %
RV: 5.1 L
TLC % pred: 147 %
TLC: 7.23 L

## 2023-12-26 MED ORDER — SPIRIVA RESPIMAT 2.5 MCG/ACT IN AERS
2.0000 | INHALATION_SPRAY | Freq: Every day | RESPIRATORY_TRACT | 11 refills | Status: DC
  Filled 2023-12-26: qty 4, 30d supply, fill #0
  Filled 2024-01-21: qty 4, 30d supply, fill #1
  Filled 2024-02-20: qty 4, 30d supply, fill #2
  Filled 2024-03-21: qty 4, 30d supply, fill #3
  Filled 2024-04-19: qty 4, 30d supply, fill #4
  Filled 2024-05-19: qty 4, 30d supply, fill #5
  Filled 2024-06-18: qty 4, 30d supply, fill #6
  Filled 2024-07-17: qty 4, 30d supply, fill #7
  Filled 2024-08-13: qty 4, 30d supply, fill #8
  Filled 2024-09-11: qty 4, 30d supply, fill #9
  Filled 2024-10-10: qty 4, 30d supply, fill #10

## 2023-12-26 MED ORDER — SPIRIVA RESPIMAT 2.5 MCG/ACT IN AERS: 2.0000 | INHALATION_SPRAY | Freq: Every day | RESPIRATORY_TRACT | Status: DC

## 2023-12-26 NOTE — Patient Instructions (Signed)
 Full PFT Performed Today

## 2023-12-26 NOTE — Progress Notes (Signed)
 Full PFT Performed Today

## 2023-12-26 NOTE — Progress Notes (Unsigned)
 Subjective:   PATIENT ID: Sophia Mccoy GENDER: female DOB: [birth year], MRN: [id number]  Chief Complaint  Patient presents with   Follow-up    Centrilobular emphysema pt states she is feeling well    Reason for Visit: Follow-up COPD  [name] is a 68 year old female former smoker with COPD, HTN, atrial flutter, HLD, GERD who presents follow-up COPD.  Initial consult She was recently hospitalized at [facility] in 06/2023 with new onset atrial flutter requiring cardizem. Noted to have severe emphysema on chest imaging. Since then she has been diuresed with improved LE edema. Was discharged on oxygen. Seen by Cardiology in follow-up on 07/31/23 and Cardizem was discontinued to as needed due to reported bradycardia.  At baseline denies cough and wheezing. Denies frequent respiratory infections with possibly abx every two years at most. Denies oxygen use except when she had covid in 05/2022 for a brief time. Has not been on maintenance inhalers until her recent hospitalizations  12/26/23 Since our last visit she reports she has being overall doing well. With activity, has shortness of breath but oxygen stays about >92% and improves with rest and deep breaths. No coughing or wheezing. Compliant with oxygen nightly.   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  Possible asbestosis exposure  Past Medical History:  Diagnosis Date   Atrial flutter (HCC)    a. diagnosed in 06/2023 and converted to NSR on IV Cardizem   COPD (chronic obstructive pulmonary disease) (HCC)    Hypertension      History reviewed. No pertinent family history.   Social History   Occupational History   Not on file  Tobacco Use   Smoking status: Former    Types: Cigarettes    Passive exposure: Past   Smokeless tobacco: Never  Vaping Use   Vaping status: Never Used  Substance and Sexual Activity   Alcohol use: No   Drug use: No   Sexual activity: Not on file     Allergies  Allergen Reactions   Codeine Nausea And Vomiting   Lisinopril Swelling     Outpatient Medications Prior to Visit  Medication Sig Dispense Refill   acetaminophen (TYLENOL) 500 MG tablet Take 1 tablet (500 mg total) by mouth every 4 (four) hours as needed for mild pain or headache (fever >/= 101).     albuterol (PROVENTIL) (2.5 MG/3ML) 0.083% nebulizer solution Take 3 mLs (2.5 mg total) by nebulization every 4 (four) hours as needed for wheezing or shortness of breath. 75 mL 2   albuterol (VENTOLIN HFA) 108 (90 Base) MCG/ACT inhaler Inhale 2 puffs into the lungs every 6 (six) hours as needed for wheezing or shortness of breath. 8 g 2   apixaban (ELIQUIS) 5 MG TABS tablet Take 1 tablet (5 mg total) by mouth 2 (two) times daily. 60 tablet 5   atorvastatin (LIPITOR) 10 MG tablet Take 1 tablet (10 mg total) by mouth daily for high cholesterol. 90 tablet 4   budesonide-formoterol (SYMBICORT) 160-4.5 MCG/ACT inhaler Inhale 2 puffs into the lungs 2 (two) times daily. 10.2 g 12   cholecalciferol (VITAMIN D) 1000 UNITS tablet Take 1,000 Units by mouth daily.     diltiazem (CARDIZEM) 30 MG tablet Take 1 tablet (30 mg total) by mouth as needed (Palpitations). 90 tablet 1   furosemide (LASIX) 20 MG tablet Take 1 tablet (20 mg total) by mouth daily. 90 tablet 1   potassium chloride SA (KLOR-CON M) 20 MEQ  tablet Take 1 tablet (20 mEq total) by mouth daily. 90 tablet 1   No facility-administered medications prior to visit.    Review of Systems  Constitutional:  Negative for chills, diaphoresis, fever, malaise/fatigue and weight loss.  HENT:  Negative for congestion.   Respiratory:  Positive for shortness of breath. Negative for cough, hemoptysis, sputum production and wheezing.   Cardiovascular:  Negative for chest pain, palpitations and leg swelling.     Objective:   Vitals:   12/26/23 1443  BP: 116/70  Pulse: 61  SpO2: 93%  Weight: 97 lb 6.4 oz (44.2 kg)  Height: 5\' 3"  (1.6 m)    SpO2: 93 %  Physical Exam: General: Well-appearing, no acute distress HENT: [place], AT Eyes: EOMI, no scleral icterus Respiratory: Diminished but clear to auscultation bilaterally.  No crackles, wheezing or rales Cardiovascular: RRR, -M/R/G, no JVD Extremities:-Edema,-tenderness Neuro: AAO x4, CNII-XII grossly intact Psych: Normal mood, normal affect  Data Reviewed:  Imaging: CT Chest 06/21/23 - Severe centrilobular emphysema. Chronic RLL bronchial occlusion but no collapse. No pulmonary nodules/masses.  PFT: 12/26/23 FVC 1.93 (64%) FEV1 0.7 (31%) Ratio 36  TLC 147% RV 209% DLCO 37% Interpretation: Very severe obstructive with air trapping, hyperinflation and severely reduced gas exchange  Labs: CBC    Component Value Date/Time   WBC 5.4 07/04/2023 0427   RBC 4.63 07/04/2023 0427   HGB 14.0 07/04/2023 0427   HCT 40.7 07/04/2023 0427   PLT 163 07/04/2023 0427   MCV 87.9 07/04/2023 0427   MCH 30.2 07/04/2023 0427   MCHC 34.4 07/04/2023 0427   RDW 12.5 07/04/2023 0427   LYMPHSABS 1.2 07/02/2023 0640   MONOABS 0.7 07/02/2023 0640   EOSABS 0.1 07/02/2023 0640   BASOSABS 0.1 07/02/2023 0640   Absolute eos 07/02/23 - 100     Assessment & Plan:   Discussion: 68 year old female with COPD, HTN, atrial flutter, HLD, GERD who presents to follow-up COPD. Reviewed PFTs. Very severe COPD. Symptoms currently controlled but due to severity of lung disease recommend triple therapy to stabilize PFT findings. Discussed clinical course and management of COPD including bronchodilator regimen, preventive care and action plan for exacerbation. Addressed questions and concerns with husband and sister present.   Severe emphysema --Reviewed pulmonary function tests. Very severe emphysema. --CONTINUE Symbicort 160-4.5 mcg TWO puffs in the morning and evening. Rinse mouth after use --START Spiriva 2.5 mcg TWO puffs ONCE a day --CONTINUE Albuterol TWO puffs as needed for shortness of breath or  wheezing  Chronic hypoxemic respiratory failure --Continue 2L nightly on continuous oxygen --Continue pulsed oxygen as needed for goal SpO2 >88%   Health Maintenance Immunization History  Administered Date(s) Administered   Influenza-Unspecified 07/19/2023   CT Lung Screen - qualified  No orders of the defined types were placed in this encounter.  Meds ordered this encounter  Medications   Tiotropium Bromide Monohydrate (SPIRIVA RESPIMAT) 2.5 MCG/ACT AERS    Sig: Inhale 2 puffs into the lungs daily.    Dispense:  4 g    Refill:  11    Return in about 6 months (around 06/26/2024).  I have spent a total time of 32-minutes on the day of the appointment including chart review, data review, collecting history, coordinating care and discussing medical diagnosis and plan with the patient/family. Past medical history, allergies, medications were reviewed. Pertinent imaging, labs and tests included in this note have been reviewed and interpreted independently by me.  Tenicia Gural Mechele Collin, MD [facility]  12/26/2023 3:22 PM

## 2023-12-26 NOTE — Patient Instructions (Signed)
 Severe emphysema --Reviewed pulmonary function tests. Very severe emphysema. --CONTINUE Symbicort 160-4.5 mcg TWO puffs in the morning and evening. Rinse mouth after use --START Spiriva 2.5 mcg TWO puffs ONCE a day --CONTINUE Albuterol TWO puffs as needed for shortness of breath or wheezing  Chronic hypoxemic respiratory failure --Continue 2L nightly on continuous oxygen --Continue pulsed oxygen as needed for goal SpO2 >88%

## 2024-01-03 ENCOUNTER — Other Ambulatory Visit: Payer: Self-pay

## 2024-01-03 ENCOUNTER — Other Ambulatory Visit (HOSPITAL_COMMUNITY): Payer: Self-pay

## 2024-01-03 ENCOUNTER — Other Ambulatory Visit: Payer: Self-pay | Admitting: Student

## 2024-01-03 DIAGNOSIS — J439 Emphysema, unspecified: Secondary | ICD-10-CM | POA: Diagnosis not present

## 2024-01-03 MED FILL — Furosemide Tab 20 MG: ORAL | 90 days supply | Qty: 90 | Fill #0 | Status: CN

## 2024-01-03 MED FILL — Apixaban Tab 5 MG: ORAL | 30 days supply | Qty: 60 | Fill #0 | Status: AC

## 2024-01-03 NOTE — Telephone Encounter (Signed)
 Prescription refill request for Eliquis received. Indication: A Flutter Last office visit: 10/16/23  B Strader PA-C Scr: 0.72 on 09/21/23  Epic Age: 68 Weight: 44.2kg  Based on above findings Eliquis 5mg  twice daily is the appropriate dose.  Refill approved.

## 2024-01-05 ENCOUNTER — Other Ambulatory Visit (HOSPITAL_COMMUNITY): Payer: Self-pay

## 2024-01-05 MED FILL — Furosemide Tab 20 MG: ORAL | 90 days supply | Qty: 90 | Fill #0 | Status: AC

## 2024-01-18 ENCOUNTER — Other Ambulatory Visit: Payer: Self-pay

## 2024-01-18 ENCOUNTER — Other Ambulatory Visit (HOSPITAL_COMMUNITY): Payer: Self-pay

## 2024-01-21 ENCOUNTER — Other Ambulatory Visit (HOSPITAL_COMMUNITY): Payer: Self-pay

## 2024-01-29 ENCOUNTER — Other Ambulatory Visit (HOSPITAL_COMMUNITY): Payer: Self-pay

## 2024-01-29 MED FILL — Apixaban Tab 5 MG: ORAL | 30 days supply | Qty: 60 | Fill #1 | Status: AC

## 2024-02-01 DIAGNOSIS — J9611 Chronic respiratory failure with hypoxia: Secondary | ICD-10-CM | POA: Diagnosis not present

## 2024-02-01 DIAGNOSIS — I5030 Unspecified diastolic (congestive) heart failure: Secondary | ICD-10-CM | POA: Diagnosis not present

## 2024-02-02 DIAGNOSIS — J439 Emphysema, unspecified: Secondary | ICD-10-CM | POA: Diagnosis not present

## 2024-02-08 DIAGNOSIS — J9611 Chronic respiratory failure with hypoxia: Secondary | ICD-10-CM | POA: Diagnosis not present

## 2024-02-08 DIAGNOSIS — I5030 Unspecified diastolic (congestive) heart failure: Secondary | ICD-10-CM | POA: Diagnosis not present

## 2024-02-18 ENCOUNTER — Other Ambulatory Visit: Payer: Self-pay | Admitting: Internal Medicine

## 2024-02-18 ENCOUNTER — Other Ambulatory Visit: Payer: Self-pay

## 2024-02-18 MED ORDER — ALBUTEROL SULFATE (2.5 MG/3ML) 0.083% IN NEBU
2.5000 mg | INHALATION_SOLUTION | RESPIRATORY_TRACT | 11 refills | Status: AC | PRN
  Filled 2024-02-18: qty 150, 9d supply, fill #0

## 2024-02-18 MED ORDER — ALBUTEROL SULFATE HFA 108 (90 BASE) MCG/ACT IN AERS
2.0000 | INHALATION_SPRAY | Freq: Four times a day (QID) | RESPIRATORY_TRACT | 2 refills | Status: AC | PRN
  Filled 2024-02-18: qty 13.4, 50d supply, fill #0

## 2024-02-18 MED ORDER — BUDESONIDE-FORMOTEROL FUMARATE 160-4.5 MCG/ACT IN AERO
2.0000 | INHALATION_SPRAY | Freq: Two times a day (BID) | RESPIRATORY_TRACT | 11 refills | Status: AC
  Filled 2024-02-18: qty 10.2, 30d supply, fill #0
  Filled 2024-03-18: qty 10.2, 30d supply, fill #1
  Filled 2024-04-13: qty 10.2, 30d supply, fill #2
  Filled 2024-05-13: qty 10.2, 30d supply, fill #3
  Filled 2024-06-11: qty 10.2, 30d supply, fill #4
  Filled 2024-07-08: qty 10.2, 30d supply, fill #5
  Filled 2024-08-06: qty 10.2, 30d supply, fill #6
  Filled 2024-09-04: qty 10.2, 30d supply, fill #7
  Filled 2024-10-01: qty 10.2, 30d supply, fill #8

## 2024-02-20 ENCOUNTER — Other Ambulatory Visit (HOSPITAL_COMMUNITY): Payer: Self-pay

## 2024-02-27 ENCOUNTER — Other Ambulatory Visit (HOSPITAL_COMMUNITY): Payer: Self-pay

## 2024-02-27 ENCOUNTER — Ambulatory Visit: Attending: Student | Admitting: Student

## 2024-02-27 ENCOUNTER — Encounter: Payer: Self-pay | Admitting: Student

## 2024-02-27 ENCOUNTER — Encounter: Payer: Self-pay | Admitting: *Deleted

## 2024-02-27 VITALS — BP 128/64 | HR 48 | Ht 64.0 in | Wt 102.4 lb

## 2024-02-27 DIAGNOSIS — J449 Chronic obstructive pulmonary disease, unspecified: Secondary | ICD-10-CM

## 2024-02-27 DIAGNOSIS — E785 Hyperlipidemia, unspecified: Secondary | ICD-10-CM | POA: Diagnosis not present

## 2024-02-27 DIAGNOSIS — Z8679 Personal history of other diseases of the circulatory system: Secondary | ICD-10-CM

## 2024-02-27 DIAGNOSIS — Z7901 Long term (current) use of anticoagulants: Secondary | ICD-10-CM | POA: Diagnosis not present

## 2024-02-27 DIAGNOSIS — I2510 Atherosclerotic heart disease of native coronary artery without angina pectoris: Secondary | ICD-10-CM | POA: Diagnosis not present

## 2024-02-27 DIAGNOSIS — I4892 Unspecified atrial flutter: Secondary | ICD-10-CM

## 2024-02-27 MED ORDER — POTASSIUM CHLORIDE CRYS ER 20 MEQ PO TBCR
20.0000 meq | EXTENDED_RELEASE_TABLET | Freq: Every day | ORAL | 1 refills | Status: AC | PRN
  Filled 2024-02-27: qty 90, 90d supply, fill #0

## 2024-02-27 MED ORDER — FUROSEMIDE 20 MG PO TABS
20.0000 mg | ORAL_TABLET | Freq: Every day | ORAL | 1 refills | Status: AC | PRN
  Filled 2024-02-27: qty 90, 90d supply, fill #0

## 2024-02-27 NOTE — Patient Instructions (Signed)
 Medication Instructions:  Your physician has recommended you make the following change in your medication:   Take Lasix  20 mg Daily as needed  Take Potassium 20 meq Daily as needed   *If you need a refill on your cardiac medications before your next appointment, please call your pharmacy*  Lab Work:  NONE   If you have labs (blood work) drawn today and your tests are completely normal, you will receive your results only by: MyChart Message (if you have MyChart) OR A paper copy in the mail If you have any lab test that is abnormal or we need to change your treatment, we will call you to review the results.  Testing/Procedures: NONE   Follow-Up: At [facility], you and your health needs are our priority.  As part of our continuing mission to provide you with exceptional heart care, our providers are all part of one team.  This team includes your primary Cardiologist (physician) and Advanced Practice Providers or APPs (Physician Assistants and Nurse Practitioners) who all work together to provide you with the care you need, when you need it.  Your next appointment:    October / November  Provider:   You may see Armida Lander, MD or one of the following Advanced Practice Providers on your designated Care Team:   Woodfin Hays, PA-C  Scotesia Pitman, [place] Theotis Flake, [place]     We recommend signing up for the patient portal called MyChart.  Sign up information is provided on this After Visit Summary.  MyChart is used to connect with patients for Virtual Visits (Telemedicine).  Patients are able to view lab/test results, encounter notes, upcoming appointments, etc.  Non-urgent messages can be sent to your provider as well.   To learn more about what you can do with MyChart, go to [email or web address].   Other Instructions Thank you for choosing [facility]!

## 2024-02-27 NOTE — Progress Notes (Signed)
 Cardiology Office Note    Date:  02/28/2024  ID:  Sophia Mccoy, Sophia Mccoy 1956/06/30, MRN [id number] Cardiologist: Sophia Lander, MD    History of Present Illness:    Sophia Mccoy is a 68 y.o. female with past medical history of HTN, HLD, COPD, GERD and paroxysmal atrial flutter with RVR (diagnosed in 06/2023) who presents to the office today for 73-month follow-up.  She was last examined by myself in 09/2023 and reported overall doing well at that time and was only using supplemental oxygen  at night and did not need this routinely during the day. She reported having occasional, brief palpitations but no symptoms resembling her atrial flutter and heart rate had overall been well-controlled when checked at home. She was encouraged to reach out if she had more frequent symptoms as a Zio patch could be placed. She was continued on Eliquis  5 mg twice daily for anticoagulation and was no longer on AV nodal blocking agents given bradycardia. Given coronary calcification by prior CT and dyspnea on exertion, a Coronary CTA was recommended for further assessment. This was performed in 10/2023 and showed mild, nonobstructive disease with 25 to 49% scattered stenoses with risk factor modification recommended.   In talking with the patient and her sister today, she reports overall doing well since her last office visit.  She reports occasional, brief fluttering sensations along her chest but these only last a few seconds and nothing resembling her prior atrial flutter. She denies any recent chest pain or dyspnea on exertion. Only using supplemental oxygen  at night. No recent orthopnea, PND or pitting edema.   Studies Reviewed:   EKG: EKG is not ordered today.  Echocardiogram: 06/2023 IMPRESSIONS     1. Left ventricular ejection fraction, by estimation, is 60 to 65%. The  left ventricle has normal function. The left ventricle has no regional  wall motion abnormalities. Left ventricular diastolic  parameters are  indeterminate.   2. Right ventricular systolic function is normal. The right ventricular  size is normal. Tricuspid regurgitation signal is inadequate for assessing  PA pressure.   3. Left atrial size was severely dilated.   4. The mitral valve is normal in structure. No evidence of mitral valve  regurgitation. No evidence of mitral stenosis.   5. The aortic valve is tricuspid. Aortic valve regurgitation is not  visualized. No aortic stenosis is present.   6. The inferior vena cava is normal in size with greater than 50%  respiratory variability, suggesting right atrial pressure of 3 mmHg.   Coronary CTA: 10/2023 IMPRESSION: 1.  Mild nonobstructive CAD, CADRADS = 2.   2. Coronary calcium  score of 324. This was 91st percentile for age-, sex-, and race- matched controls.   3. Total plaque volume not available (not sent to heartflow).   4. Normal coronary origin with right dominance.   5.  Aortic atherosclerosis, very prominent in descending aorta.   INTERPRETATION:   CAD-RADS 2: Mild non-obstructive CAD (25-49%). Consider non-atherosclerotic causes of chest pain. Consider preventive therapy and risk factor modification.  Risk Assessment/Calculations:    CHA2DS2-VASc Score = 4   This indicates a 4.8% annual risk of stroke. The patient's score is based upon: CHF History: 0 HTN History: 1 Diabetes History: 0 Stroke History: 0 Vascular Disease History: 1 Age Score: 1 Gender Score: 1    Physical Exam:   VS:  BP 128/64   Pulse (!) 48   Ht 5' 4 (1.626 m)   Wt 102 lb 6.4 oz (  46.4 kg)   SpO2 97%   BMI 17.58 kg/m    Wt Readings from Last 3 Encounters:  02/27/24 102 lb 6.4 oz (46.4 kg)  12/26/23 97 lb 6.4 oz (44.2 kg)  12/26/23 97 lb 6.4 oz (44.2 kg)     GEN: Well nourished, well developed female appearing in no acute distress NECK: No JVD; No carotid bruits CARDIAC: RRR, no murmurs, rubs, gallops RESPIRATORY:  Clear to auscultation without rales,  wheezing or rhonchi  ABDOMEN: Appears non-distended. No obvious abdominal masses. EXTREMITIES: No clubbing or cyanosis. No pitting edema.  Distal pedal pulses are 2+ bilaterally.   Assessment and Plan:   1. Atrial flutter/Current use of long term anticoagulation - She is maintaining normal sinus rhythm by examination today. She is no longer on AV nodal blocking agents given baseline bradycardia. - She asked when she can possibly stop Eliquis  and she is in agreement with remaining on this for now. At the time of her next visit, would readdress and could consider placing a 30-day monitor to assess for recurrence if she wants to stop anticoagulation at that time. For now, continue Eliquis  5 mg twice daily for anticoagulation which is the appropriate dose given her current age, weight and renal function.  2. History of HFpEF - Prior echocardiogram in 06/2023 showed a preserved EF of 60 to 65%. She did previously have fluid accumulation in the setting of atrial flutter with RVR. She appears euvolemic by examination today and denies any recent respiratory issues. - She has been taking Lasix  20 mg daily and will reduce this along with potassium supplementation to PRN dosing for weight gain or worsening edema. Was encouraged to continue to limit her sodium intake.  3. Coronary artery calcification seen on CT scan/HLD - Recent Coronary CTA in 10/2023 showed mild, nonobstructive disease as outlined above. She is not on ASA given the need for anticoagulation and she has been continued on Atorvastatin  10 mg daily. - Her LDL was at 94 in 04/2023 and she reports having recent labs with her PCP in the interim. Will request a copy of these. If LDL was not checked or remains above goal, would titrate Atorvastatin  to 20 mg daily with follow-up FLP and LFT's in 6 to 8 weeks.  4. Chronic obstructive pulmonary disease, unspecified COPD type (HCC) - Only using supplemental oxygen  at night. Followed by Dr. Washington Hacker with  [facility].   Signed, Dorma Gash, PA-C

## 2024-02-28 ENCOUNTER — Other Ambulatory Visit: Payer: Self-pay

## 2024-02-28 ENCOUNTER — Other Ambulatory Visit (HOSPITAL_COMMUNITY): Payer: Self-pay

## 2024-02-28 ENCOUNTER — Encounter: Payer: Self-pay | Admitting: Student

## 2024-02-28 MED FILL — Apixaban Tab 5 MG: ORAL | 30 days supply | Qty: 60 | Fill #2 | Status: AC

## 2024-03-04 DIAGNOSIS — J439 Emphysema, unspecified: Secondary | ICD-10-CM | POA: Diagnosis not present

## 2024-03-06 ENCOUNTER — Other Ambulatory Visit: Payer: Self-pay

## 2024-03-06 ENCOUNTER — Other Ambulatory Visit (HOSPITAL_COMMUNITY): Payer: Self-pay

## 2024-03-06 ENCOUNTER — Telehealth: Payer: Self-pay | Admitting: Student

## 2024-03-06 DIAGNOSIS — E7849 Other hyperlipidemia: Secondary | ICD-10-CM

## 2024-03-06 MED ORDER — ATORVASTATIN CALCIUM 20 MG PO TABS
20.0000 mg | ORAL_TABLET | Freq: Every day | ORAL | 3 refills | Status: DC
  Filled 2024-03-06: qty 90, 90d supply, fill #0
  Filled 2024-05-31: qty 90, 90d supply, fill #1

## 2024-03-06 NOTE — Telephone Encounter (Signed)
 Spoke with pt and informed of B.Strader's note. Pt in agreement of medication changes and lab work in 2 months.

## 2024-03-06 NOTE — Telephone Encounter (Signed)
    Please let the patient know that we received a copy of labs from her PCP and they did not recheck her cholesterol at that time. If she is in agreement, would recommend titration of Atorvastatin  from 10 mg daily to 20 mg daily as discussed the time of her office visit and recheck an FLP and LFT's in 2 months for reassessment.  Signed, Dorma Gash, PA-C 03/06/2024, 4:11 PM

## 2024-03-18 ENCOUNTER — Other Ambulatory Visit: Payer: Self-pay

## 2024-03-22 ENCOUNTER — Other Ambulatory Visit (HOSPITAL_COMMUNITY): Payer: Self-pay

## 2024-03-29 ENCOUNTER — Other Ambulatory Visit (HOSPITAL_COMMUNITY): Payer: Self-pay

## 2024-03-29 MED FILL — Apixaban Tab 5 MG: ORAL | 30 days supply | Qty: 60 | Fill #3 | Status: AC

## 2024-04-03 DIAGNOSIS — J439 Emphysema, unspecified: Secondary | ICD-10-CM | POA: Diagnosis not present

## 2024-04-14 ENCOUNTER — Other Ambulatory Visit: Payer: Self-pay

## 2024-04-19 ENCOUNTER — Other Ambulatory Visit (HOSPITAL_COMMUNITY): Payer: Self-pay

## 2024-04-25 ENCOUNTER — Other Ambulatory Visit (HOSPITAL_COMMUNITY): Payer: Self-pay

## 2024-04-25 MED FILL — Apixaban Tab 5 MG: ORAL | 30 days supply | Qty: 60 | Fill #4 | Status: AC

## 2024-04-29 DIAGNOSIS — E440 Moderate protein-calorie malnutrition: Secondary | ICD-10-CM | POA: Diagnosis not present

## 2024-04-29 DIAGNOSIS — I5030 Unspecified diastolic (congestive) heart failure: Secondary | ICD-10-CM | POA: Diagnosis not present

## 2024-04-29 DIAGNOSIS — Z79899 Other long term (current) drug therapy: Secondary | ICD-10-CM | POA: Diagnosis not present

## 2024-04-29 DIAGNOSIS — J9611 Chronic respiratory failure with hypoxia: Secondary | ICD-10-CM | POA: Diagnosis not present

## 2024-04-29 DIAGNOSIS — I10 Essential (primary) hypertension: Secondary | ICD-10-CM | POA: Diagnosis not present

## 2024-04-29 DIAGNOSIS — E876 Hypokalemia: Secondary | ICD-10-CM | POA: Diagnosis not present

## 2024-05-04 DIAGNOSIS — J439 Emphysema, unspecified: Secondary | ICD-10-CM | POA: Diagnosis not present

## 2024-05-13 ENCOUNTER — Other Ambulatory Visit (HOSPITAL_COMMUNITY): Payer: Self-pay

## 2024-05-13 ENCOUNTER — Other Ambulatory Visit: Payer: Self-pay

## 2024-05-19 ENCOUNTER — Other Ambulatory Visit (HOSPITAL_COMMUNITY): Payer: Self-pay

## 2024-05-25 MED FILL — Apixaban Tab 5 MG: ORAL | 30 days supply | Qty: 60 | Fill #5 | Status: AC

## 2024-05-26 ENCOUNTER — Other Ambulatory Visit: Payer: Self-pay

## 2024-06-02 ENCOUNTER — Other Ambulatory Visit (HOSPITAL_COMMUNITY): Payer: Self-pay

## 2024-06-04 DIAGNOSIS — J439 Emphysema, unspecified: Secondary | ICD-10-CM | POA: Diagnosis not present

## 2024-06-11 ENCOUNTER — Other Ambulatory Visit: Payer: Self-pay

## 2024-06-18 ENCOUNTER — Other Ambulatory Visit: Payer: Self-pay

## 2024-06-22 ENCOUNTER — Other Ambulatory Visit: Payer: Self-pay | Admitting: Student

## 2024-06-23 ENCOUNTER — Other Ambulatory Visit (HOSPITAL_COMMUNITY): Payer: Self-pay

## 2024-06-23 ENCOUNTER — Other Ambulatory Visit: Payer: Self-pay

## 2024-06-23 MED ORDER — APIXABAN 5 MG PO TABS
5.0000 mg | ORAL_TABLET | Freq: Two times a day (BID) | ORAL | 5 refills | Status: DC
  Filled 2024-06-23: qty 60, 30d supply, fill #0
  Filled 2024-07-20: qty 60, 30d supply, fill #1

## 2024-06-23 NOTE — Telephone Encounter (Signed)
 Prescription refill request for Eliquis  received. Indication: A Flutter Last office visit: 02/27/24  B Strader PA-C Scr: 0.67 on 02/01/24  Epic Age: 68 Weight: 46.4kg  Based on above findings Eliquis  5mg  twice daily is the appropriate dose.  Refill approved.

## 2024-07-04 DIAGNOSIS — J439 Emphysema, unspecified: Secondary | ICD-10-CM | POA: Diagnosis not present

## 2024-07-08 ENCOUNTER — Other Ambulatory Visit: Payer: Self-pay

## 2024-07-17 ENCOUNTER — Other Ambulatory Visit: Payer: Self-pay

## 2024-07-21 ENCOUNTER — Other Ambulatory Visit: Payer: Self-pay

## 2024-07-25 ENCOUNTER — Ambulatory Visit (HOSPITAL_BASED_OUTPATIENT_CLINIC_OR_DEPARTMENT_OTHER): Admitting: Pulmonary Disease

## 2024-07-25 ENCOUNTER — Encounter (HOSPITAL_BASED_OUTPATIENT_CLINIC_OR_DEPARTMENT_OTHER): Payer: Self-pay | Admitting: Pulmonary Disease

## 2024-07-25 VITALS — BP 122/52 | HR 44 | Ht 64.0 in | Wt 101.7 lb

## 2024-07-25 DIAGNOSIS — J9611 Chronic respiratory failure with hypoxia: Secondary | ICD-10-CM | POA: Diagnosis not present

## 2024-07-25 DIAGNOSIS — J432 Centrilobular emphysema: Secondary | ICD-10-CM | POA: Diagnosis not present

## 2024-07-25 DIAGNOSIS — R0602 Shortness of breath: Secondary | ICD-10-CM

## 2024-07-25 NOTE — Assessment & Plan Note (Addendum)
--  Ambulatory O2 today. Nadir SpO2 91%. Ok to DISCONTINUE pulsed oxygen  --CONTINUE 2L nightly on continuous oxygen  --Will order overnight oximetry on room air to determine night O2 needs

## 2024-07-25 NOTE — Assessment & Plan Note (Signed)
 Severe FEV1 (31%) --CONTINUE Symbicort  160-4.5 mcg TWO puffs in the morning and evening. Rinse mouth after use --CONTINUE Spiriva  2.5 mcg TWO puffs ONCE a day --CONTINUE Albuterol  TWO puffs AS NEEDED

## 2024-07-25 NOTE — Progress Notes (Signed)
 Subjective:   PATIENT ID: Sophia Mccoy GENDER: female DOB: 07/25/1956, MRN: [id number]  Chief Complaint  Patient presents with   Emphysema    Follow up emphysema/copd 6 months    Reason for Visit: Follow-up COPD  Sophia Mccoy is a 68 year old female former smoker with COPD, HTN, atrial flutter, HLD, GERD who presents follow-up COPD.  Initial consult She was recently hospitalized at [facility] in 06/2023 with new onset atrial flutter requiring cardizem . Noted to have severe emphysema on chest imaging. Since then she has been diuresed with improved LE edema. Was discharged on oxygen . Seen by Cardiology in follow-up on 07/31/23 and Cardizem  was discontinued to as needed due to reported bradycardia.  At baseline denies cough and wheezing. Denies frequent respiratory infections with possibly abx every two years at most. Denies oxygen  use except when she had covid in 05/2022 for a brief time. Has not been on maintenance inhalers until her recent hospitalizations  12/26/23 Since our last visit she reports she has being overall doing well. With activity, has shortness of breath but oxygen  stays about >92% and improves with rest and deep breaths. No coughing or wheezing. Compliant with oxygen  nightly.   07/25/24 Since our last visit she reports she is overall doing well. Compliant with nocturnal oxygen . Day time oxygen  has been 94-95%. Denies shortness of breath, cough and wheezing. No exacerbations since our last visit.  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  Possible asbestosis exposure  Past Medical History:  Diagnosis Date   Atrial flutter (HCC)    a. diagnosed in 06/2023 and converted to NSR on IV Cardizem    COPD (chronic obstructive pulmonary disease) (HCC)    Hypertension      History reviewed. No pertinent family history.   Social History   Occupational History   Not on file  Tobacco Use   Smoking status: Former    Current packs/day:  0.00    Types: Cigarettes    Quit date: 2023    Years since quitting: 2.8    Passive exposure: Past   Smokeless tobacco: Never  Vaping Use   Vaping status: Never Used  Substance and Sexual Activity   Alcohol use: No   Drug use: No   Sexual activity: Not on file    Allergies  Allergen Reactions   Codeine Nausea And Vomiting   Lisinopril Swelling     Outpatient Medications Prior to Visit  Medication Sig Dispense Refill   acetaminophen  (TYLENOL ) 500 MG tablet Take 1 tablet (500 mg total) by mouth every 4 (four) hours as needed for mild pain or headache (fever >/= 101).     albuterol  (PROVENTIL ) (2.5 MG/3ML) 0.083% nebulizer solution Take 3 mLs (2.5 mg total) by nebulization every 4 (four) hours as needed for wheezing or shortness of breath. 120 mL 11   albuterol  (VENTOLIN  HFA) 108 (90 Base) MCG/ACT inhaler Inhale 2 puffs into the lungs every 6 (six) hours as needed for wheezing or shortness of breath. 8 g 2   apixaban  (ELIQUIS ) 5 MG TABS tablet Take 1 tablet (5 mg total) by mouth 2 (two) times daily. 60 tablet 5   atorvastatin  (LIPITOR) 20 MG tablet Take 1 tablet (20 mg total) by mouth daily. 90 tablet 3   budesonide -formoterol  (SYMBICORT ) 160-4.5 MCG/ACT inhaler Inhale 2 puffs into the lungs 2 (two) times daily. 10.2 g 11   cholecalciferol  (VITAMIN D ) 1000 UNITS tablet Take 1,000 Units by mouth daily.  diltiazem  (CARDIZEM ) 30 MG tablet Take 1 tablet (30 mg total) by mouth as needed (Palpitations). 90 tablet 1   furosemide  (LASIX ) 20 MG tablet Take 1 tablet (20 mg total) by mouth daily as needed. 90 tablet 1   potassium chloride  SA (KLOR-CON  M) 20 MEQ tablet Take 1 tablet (20 mEq total) by mouth daily as needed. 90 tablet 1   Tiotropium Bromide  (SPIRIVA  RESPIMAT) 2.5 MCG/ACT AERS Inhale 2 puffs into the lungs daily. 4 g 11   Tiotropium Bromide  Monohydrate (SPIRIVA  RESPIMAT) 2.5 MCG/ACT AERS Inhale 2 puffs into the lungs daily.     No facility-administered medications prior to  visit.    Review of Systems  Constitutional:  Negative for chills, diaphoresis, fever, malaise/fatigue and weight loss.  HENT:  Negative for congestion.   Respiratory:  Positive for shortness of breath. Negative for cough, hemoptysis, sputum production and wheezing.   Cardiovascular:  Negative for chest pain, palpitations and leg swelling.     Objective:   Vitals:   07/25/24 0812  BP: (!) 122/52  Pulse: (!) 44  SpO2: 99%  Weight: 101 lb 11.2 oz (46.1 kg)  Height: 5' 4 (1.626 m)   SpO2: 99 %  Physical Exam: General: Well-appearing, no acute distress HENT: [place], AT Eyes: EOMI, no scleral icterus Respiratory: Clear to auscultation bilaterally.  No crackles, wheezing or rales Cardiovascular: RRR, -M/R/G, no JVD Extremities:-Edema,-tenderness Neuro: AAO x4, CNII-XII grossly intact Psych: Normal mood, normal affect   Data Reviewed:  Imaging: CT Chest 06/21/23 - Severe centrilobular emphysema. Chronic RLL bronchial occlusion but no collapse. No pulmonary nodules/masses.  PFT: 12/26/23 FVC 1.93 (64%) FEV1 0.7 (31%) Ratio 36  TLC 147% RV 209% DLCO 37% Interpretation: Very severe obstructive with air trapping, hyperinflation and severely reduced gas exchange  Labs: CBC    Component Value Date/Time   WBC 5.4 07/04/2023 0427   RBC 4.63 07/04/2023 0427   HGB 14.0 07/04/2023 0427   HCT 40.7 07/04/2023 0427   PLT 163 07/04/2023 0427   MCV 87.9 07/04/2023 0427   MCH 30.2 07/04/2023 0427   MCHC 34.4 07/04/2023 0427   RDW 12.5 07/04/2023 0427   LYMPHSABS 1.2 07/02/2023 0640   MONOABS 0.7 07/02/2023 0640   EOSABS 0.1 07/02/2023 0640   BASOSABS 0.1 07/02/2023 0640   Absolute eos 07/02/23 - 100     Assessment & Plan:   Discussion: 68 year old female with COPD, HTN, atrial flutter, HLD, GERD who presents to follow-up COPD. Reviewed PFTs. Very severe COPD. Symptoms currently controlled but due to severity of lung disease recommend triple therapy to stabilize PFT findings.  Discussed clinical course and management of COPD including bronchodilator regimen, preventive care and action plan for exacerbation. Addressed questions and concerns with husband and sister present.    Assessment & Plan Centrilobular emphysema (HCC) Severe FEV1 (31%) --CONTINUE Symbicort  160-4.5 mcg TWO puffs in the morning and evening. Rinse mouth after use --CONTINUE Spiriva  2.5 mcg TWO puffs ONCE a day --CONTINUE Albuterol  TWO puffs AS NEEDED Chronic hypoxemic respiratory failure (HCC) --Ambulatory O2 today. Nadir SpO2 91%. Ok to DISCONTINUE pulsed oxygen  --CONTINUE 2L nightly on continuous oxygen  --Will order overnight oximetry on room air to determine night O2 needs   Health Maintenance Immunization History  Administered Date(s) Administered   Fluad Trivalent(High Dose 65+) 07/10/2024   Influenza-Unspecified 07/19/2023   CT Lung Screen - qualified  Orders Placed This Encounter  Procedures   Pulse oximetry, overnight    Standing Status:   Future  Expiration Date:   07/25/2025    Scheduling Instructions:     On room air   No orders of the defined types were placed in this encounter.   Return in about 6 months (around 01/22/2025).  I have spent a total time of 35-minutes on the day of the appointment including chart review, data review, collecting history, coordinating care and discussing medical diagnosis and plan with the patient/family. Past medical history, allergies, medications were reviewed. Pertinent imaging, labs and tests included in this note have been reviewed and interpreted independently by me.  Damyon Mullane Slater Staff, MD [facility] 07/25/2024 12:17 PM

## 2024-07-26 ENCOUNTER — Encounter (HOSPITAL_BASED_OUTPATIENT_CLINIC_OR_DEPARTMENT_OTHER): Payer: Self-pay | Admitting: Pulmonary Disease

## 2024-08-04 DIAGNOSIS — J439 Emphysema, unspecified: Secondary | ICD-10-CM | POA: Diagnosis not present

## 2024-08-05 NOTE — Progress Notes (Unsigned)
 Cardiology Office Note    Date:  08/06/2024  ID:  Sophia, Mccoy 1956-09-17, MRN [id number] Cardiologist: Alvan Carrier, MD Cardiology APP:  Johnson Laymon HERO, PA-C { :  History of Present Illness:    Sophia Mccoy is a 68 y.o. female with past medical history of CAD (s/p Coronary CTA in 10/2023 showing mild, nonobstructive CAD), HTN, HLD, COPD, GERD and paroxysmal atrial flutter with RVR (diagnosed in 06/2023) who presents to the office today for 65-month follow-up.  She was last examined by myself in 02/2024 and reported overall feeling well and denied any chest pain or dyspnea on exertion.  Did report occasional palpitations but symptoms would only last for a few seconds and then resolve. She had previously questioned when she could stop Eliquis  and it was recommended if wanting to stop this in the future, would place a 30-day monitor to assess for recurrence. She was continued on Eliquis  at that time but was not on AV nodal blocking agents given baseline bradycardia. She had been taking Lasix  20 mg daily and given no recent fluid issues, this was reduced to as needed dosing.  In talking with the patient and her sister today, she reports overall doing well from a cardiac perspective since her last office visit.  She still works full-time here at [employer] and does chores around her home and denies any chest pain or palpitations.  Does have intermittent dyspnea but this has overall improved and she has not had to use supplemental oxygen  during the day. Does use oxygen  at nighttime but is planning to undergo a sleep study in the near future which is being arranged by Pulmonology. No recent orthopnea, PND or pitting edema. She did have an isolated episode of lower extremity edema earlier this month which occurred after consuming pizza and resolved with 1 dose of Lasix . She remains on Eliquis  for anticoagulation and reports easy bruising with this but no melena, hematochezia or  hematuria.  Studies Reviewed:   EKG: EKG is not ordered today.  Echocardiogram: 06/2023 IMPRESSIONS     1. Left ventricular ejection fraction, by estimation, is 60 to 65%. The  left ventricle has normal function. The left ventricle has no regional  wall motion abnormalities. Left ventricular diastolic parameters are  indeterminate.   2. Right ventricular systolic function is normal. The right ventricular  size is normal. Tricuspid regurgitation signal is inadequate for assessing  PA pressure.   3. Left atrial size was severely dilated.   4. The mitral valve is normal in structure. No evidence of mitral valve  regurgitation. No evidence of mitral stenosis.   5. The aortic valve is tricuspid. Aortic valve regurgitation is not  visualized. No aortic stenosis is present.   6. The inferior vena cava is normal in size with greater than 50%  respiratory variability, suggesting right atrial pressure of 3 mmHg.    Coronary CTA: 10/2023 IMPRESSION: 1.  Mild nonobstructive CAD, CADRADS = 2.   2. Coronary calcium  score of 324. This was 91st percentile for age-, sex-, and race- matched controls.   3. Total plaque volume not available (not sent to heartflow).   4. Normal coronary origin with right dominance.   5.  Aortic atherosclerosis, very prominent in descending aorta.   INTERPRETATION:   CAD-RADS 2: Mild non-obstructive CAD (25-49%). Consider non-atherosclerotic causes of chest pain. Consider preventive therapy and risk factor modification.  Risk Assessment/Calculations:    CHA2DS2-VASc Score = 4  This indicates a 4.8% annual  risk of stroke. The patient's score is based upon: CHF History: 0 HTN History: 1 Diabetes History: 0 Stroke History: 0 Vascular Disease History: 1 Age Score: 1 Gender Score: 1   Physical Exam:   VS:  BP (!) 158/74 (BP Location: Right Arm, Cuff Size: Normal)   Pulse (!) 44   Ht 5' 4 (1.626 m)   Wt 103 lb (46.7 kg)   SpO2 97%   BMI 17.68  kg/m    Wt Readings from Last 3 Encounters:  08/06/24 103 lb (46.7 kg)  07/25/24 101 lb 11.2 oz (46.1 kg)  02/27/24 102 lb 6.4 oz (46.4 kg)     GEN: Pleasant female appearing in no acute distress NECK: No JVD; No carotid bruits CARDIAC: RRR, no murmurs, rubs, gallops RESPIRATORY:  Clear to auscultation without rales, wheezing or rhonchi  ABDOMEN: Appears non-distended. No obvious abdominal masses. EXTREMITIES: No clubbing or cyanosis. No pitting edema.  Distal pedal pulses are 2+ bilaterally.   Assessment and Plan:   1. Atrial flutter, unspecified type (HCC) - She was initially diagnosed with the arrhythmia during admission in 06/2023 and did convert back to normal sinus rhythm. She denies any recent palpitations and has not been on AV nodal blocking agents given baseline bradycardia.  - We had an in-depth discussion today in regards to risks and benefits of continuing anticoagulation and she wants to continue for now. Continue Eliquis  5 mg twice daily for anticoagulation which is the appropriate dose given her age (68 years old), weight (103 lbs) and creatinine (0.67 when checked in 04/2024). Labs in 04/2024 also showed her hemoglobin was stable at 13.2 and platelets at 229 K.   2. Coronary artery calcification seen on CT Scan/HLD - Prior Coronary CTA in 10/2023 showed mild, nonobstructive disease with 25 to 49% stenosis. She is active at baseline and denies any recent anginal symptoms. - Recent FLP showed her LDL was at 94 and Atorvastatin  was recently titrated to 20 mg daily. Would plan to recheck at the time of her next office visit if not obtained by her PCP in the interim. If LDL remains above goal of 70, would titrate Atorvastatin  to 40 mg daily or switch to Crestor 20 mg daily.  3. History of CHF (congestive heart failure) - Prior echocardiogram in 06/2023 showed a preserved EF of 60 to 65%. Previously had fluid issues in the setting of atrial flutter with RVR but only one episode  within the past few months which occurred in the setting of dietary indiscretion. Reviewed the importance of continuing to limit sodium intake. She will continue on Lasix  20 mg as needed.  4.  Elevated BP without diagnosis of HTN - BP was recorded at 158/74 during today's visit but she says this has overall been well-controlled when checked at home with SBP in the 110's and BP has been well-controlled at prior office visits. Will continue to monitor for now.  Disposition: Will plan for follow-up in 6 months unless issues arise in the interim.   Signed, Laymon CHRISTELLA Qua, PA-C

## 2024-08-06 ENCOUNTER — Encounter: Payer: Self-pay | Admitting: Student

## 2024-08-06 ENCOUNTER — Ambulatory Visit: Attending: Student | Admitting: Student

## 2024-08-06 ENCOUNTER — Other Ambulatory Visit (HOSPITAL_COMMUNITY): Payer: Self-pay

## 2024-08-06 VITALS — BP 158/74 | HR 44 | Ht 64.0 in | Wt 103.0 lb

## 2024-08-06 DIAGNOSIS — I4892 Unspecified atrial flutter: Secondary | ICD-10-CM

## 2024-08-06 DIAGNOSIS — I2510 Atherosclerotic heart disease of native coronary artery without angina pectoris: Secondary | ICD-10-CM | POA: Diagnosis not present

## 2024-08-06 DIAGNOSIS — Z8679 Personal history of other diseases of the circulatory system: Secondary | ICD-10-CM

## 2024-08-06 DIAGNOSIS — E785 Hyperlipidemia, unspecified: Secondary | ICD-10-CM | POA: Diagnosis not present

## 2024-08-06 MED ORDER — ATORVASTATIN CALCIUM 20 MG PO TABS
20.0000 mg | ORAL_TABLET | Freq: Every day | ORAL | 3 refills | Status: AC
  Filled 2024-08-06 – 2024-08-29 (×2): qty 90, 90d supply, fill #0

## 2024-08-06 MED ORDER — APIXABAN 5 MG PO TABS
5.0000 mg | ORAL_TABLET | Freq: Two times a day (BID) | ORAL | 3 refills | Status: AC
  Filled 2024-08-06 – 2024-08-19 (×2): qty 180, 90d supply, fill #0

## 2024-08-06 NOTE — Patient Instructions (Signed)

## 2024-08-11 ENCOUNTER — Telehealth (HOSPITAL_BASED_OUTPATIENT_CLINIC_OR_DEPARTMENT_OTHER): Payer: Self-pay

## 2024-08-11 ENCOUNTER — Encounter: Payer: Self-pay | Admitting: Pulmonary Disease

## 2024-08-11 DIAGNOSIS — R0602 Shortness of breath: Secondary | ICD-10-CM | POA: Diagnosis not present

## 2024-08-11 NOTE — Telephone Encounter (Signed)
 Copied from CRM #[id number]. Topic: General - Other >> Aug 11, 2024  9:28 AM Corean SAUNDERS wrote: Reason for CRM: Channing from [employer] states Dr. Kassie ordered an overnight oximetry testing on this patient but insurance does not cover this. Channing is faxing a list of tests that the patients insurance does cover to [facility] front desk.

## 2024-08-11 NOTE — Telephone Encounter (Signed)
 [employer] form filled out and signed by provider fax sent and confirmation received to [phone]

## 2024-08-11 NOTE — Addendum Note (Signed)
 Addended by: KASSIE ACQUANETTA BRADLEY on: 08/11/2024 04:27 PM   Modules accepted: Orders

## 2024-08-12 ENCOUNTER — Ambulatory Visit: Payer: Self-pay | Admitting: Pulmonary Disease

## 2024-08-12 DIAGNOSIS — J9611 Chronic respiratory failure with hypoxia: Secondary | ICD-10-CM

## 2024-08-12 DIAGNOSIS — J449 Chronic obstructive pulmonary disease, unspecified: Secondary | ICD-10-CM

## 2024-08-12 NOTE — Progress Notes (Signed)
 Overnight Oximetry Results Date: 08/10/24 SpO2 <88% 0 hour 39 min 25 sec.  Nadir SpO2 83%  Qualified for oxygen   Assessment/Plan  Nocturnal Hypoxemia --RECERTIFY for 2L O2 via  nightly  --Please DISCONTINUE PORTABLE oxygen . Ambulatory O2 with nadir SpO2 91%.

## 2024-08-13 ENCOUNTER — Other Ambulatory Visit: Payer: Self-pay

## 2024-08-19 ENCOUNTER — Other Ambulatory Visit (HOSPITAL_COMMUNITY): Payer: Self-pay

## 2024-08-19 ENCOUNTER — Telehealth (HOSPITAL_COMMUNITY): Payer: Self-pay | Admitting: Pharmacy Technician

## 2024-08-19 ENCOUNTER — Other Ambulatory Visit: Payer: Self-pay

## 2024-08-20 ENCOUNTER — Other Ambulatory Visit (HOSPITAL_COMMUNITY): Payer: Self-pay

## 2024-08-26 DIAGNOSIS — I483 Typical atrial flutter: Secondary | ICD-10-CM | POA: Diagnosis not present

## 2024-08-26 DIAGNOSIS — I5030 Unspecified diastolic (congestive) heart failure: Secondary | ICD-10-CM | POA: Diagnosis not present

## 2024-08-26 DIAGNOSIS — J9611 Chronic respiratory failure with hypoxia: Secondary | ICD-10-CM | POA: Diagnosis not present

## 2024-08-29 ENCOUNTER — Other Ambulatory Visit (HOSPITAL_BASED_OUTPATIENT_CLINIC_OR_DEPARTMENT_OTHER): Payer: Self-pay

## 2024-08-29 ENCOUNTER — Other Ambulatory Visit: Payer: Self-pay

## 2024-09-03 DIAGNOSIS — J439 Emphysema, unspecified: Secondary | ICD-10-CM | POA: Diagnosis not present

## 2024-09-04 ENCOUNTER — Other Ambulatory Visit: Payer: Self-pay

## 2024-09-12 ENCOUNTER — Other Ambulatory Visit (HOSPITAL_BASED_OUTPATIENT_CLINIC_OR_DEPARTMENT_OTHER): Payer: Self-pay

## 2024-10-01 ENCOUNTER — Other Ambulatory Visit: Payer: Self-pay

## 2024-10-10 ENCOUNTER — Other Ambulatory Visit (HOSPITAL_BASED_OUTPATIENT_CLINIC_OR_DEPARTMENT_OTHER): Payer: Self-pay | Admitting: Pulmonary Disease

## 2024-10-10 ENCOUNTER — Other Ambulatory Visit: Payer: Self-pay

## 2024-10-10 MED ORDER — SPIRIVA RESPIMAT 2.5 MCG/ACT IN AERS
2.0000 | INHALATION_SPRAY | Freq: Every day | RESPIRATORY_TRACT | 11 refills | Status: AC
  Filled 2024-10-10: qty 12, 90d supply, fill #0

## 2024-10-13 ENCOUNTER — Other Ambulatory Visit (HOSPITAL_COMMUNITY): Payer: Self-pay

## 2025-01-28 ENCOUNTER — Ambulatory Visit (HOSPITAL_BASED_OUTPATIENT_CLINIC_OR_DEPARTMENT_OTHER): Admitting: Pulmonary Disease

## 2025-02-10 ENCOUNTER — Ambulatory Visit (HOSPITAL_BASED_OUTPATIENT_CLINIC_OR_DEPARTMENT_OTHER): Admitting: Pulmonary Disease
